# Patient Record
Sex: Female | Born: 2000 | Race: White | Hispanic: No | Marital: Single | Smoking: Never smoker
Health system: Southern US, Community
[De-identification: ages and names within clinical notes are randomized; demographics above are authoritative.]

## PROBLEM LIST (undated history)

## (undated) DIAGNOSIS — G43909 Migraine, unspecified, not intractable, without status migrainosus: Secondary | ICD-10-CM

## (undated) DIAGNOSIS — J309 Allergic rhinitis, unspecified: Secondary | ICD-10-CM

## (undated) HISTORY — PX: TYMPANOSTOMY TUBE PLACEMENT: SHX32

## (undated) HISTORY — DX: Allergic rhinitis, unspecified: J30.9

## (undated) HISTORY — DX: Migraine, unspecified, not intractable, without status migrainosus: G43.909

## (undated) HISTORY — PX: TYMPANOPLASTY: SHX33

## (undated) HISTORY — PX: WISDOM TOOTH EXTRACTION: SHX21

## (undated) HISTORY — PX: UPPER GI ENDOSCOPY: SHX6162

---
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
## 2021-04-16 ENCOUNTER — Encounter (INDEPENDENT_AMBULATORY_CARE_PROVIDER_SITE_OTHER): Payer: Self-pay

## 2021-04-17 ENCOUNTER — Encounter: Payer: Self-pay | Admitting: Family Medicine

## 2021-04-17 ENCOUNTER — Ambulatory Visit (INDEPENDENT_AMBULATORY_CARE_PROVIDER_SITE_OTHER): Payer: BC Managed Care – PPO | Admitting: Family Medicine

## 2021-04-17 VITALS — HR 117 | Temp 98.8°F | Ht 61.52 in | Wt 118.0 lb

## 2021-04-17 DIAGNOSIS — J019 Acute sinusitis, unspecified: Secondary | ICD-10-CM

## 2021-04-17 DIAGNOSIS — B9689 Other specified bacterial agents as the cause of diseases classified elsewhere: Secondary | ICD-10-CM

## 2021-04-17 DIAGNOSIS — J029 Acute pharyngitis, unspecified: Secondary | ICD-10-CM | POA: Diagnosis not present

## 2021-04-17 LAB — POCT RAPID STREP A (OFFICE): Rapid Strep A Screen: NEGATIVE

## 2021-04-17 MED ORDER — DOXYCYCLINE HYCLATE 100 MG PO TABS: 100.0000 mg | ORAL_TABLET | Freq: Two times a day (BID) | ORAL | 0 refills | Status: DC

## 2021-04-17 NOTE — Patient Instructions (Signed)
If all symptoms including tender area behind right ear does not completely go away, come back.       Sinusitis, Adult Sinusitis is soreness and swelling (inflammation) of your sinuses. Sinuses are hollow spaces in the bones around your face. They are located:  Around your eyes.  In the middle of your forehead.  Behind your nose.  In your cheekbones. Your sinuses and nasal passages are lined with a fluid called mucus. Mucus drains out of your sinuses. Swelling can trap mucus in your sinuses. This lets germs (bacteria, virus, or fungus) grow, which leads to infection. Most of the time, this condition is caused by a virus. What are the causes? This condition is caused by:  Allergies.  Asthma.  Germs.  Things that block your nose or sinuses.  Growths in the nose (nasal polyps).  Chemicals or irritants in the air.  Fungus (rare). What increases the risk? You are more likely to develop this condition if:  You have a weak body defense system (immune system).  You do a lot of swimming or diving.  You use nasal sprays too much.  You smoke. What are the signs or symptoms? The main symptoms of this condition are pain and a feeling of pressure around the sinuses. Other symptoms include:  Stuffy nose (congestion).  Runny nose (drainage).  Swelling and warmth in the sinuses.  Headache.  Toothache.  A cough that may get worse at night.  Mucus that collects in the throat or the back of the nose (postnasal drip).  Being unable to smell and taste.  Being very tired (fatigue).  A fever.  Sore throat.  Bad breath. How is this diagnosed? This condition is diagnosed based on:  Your symptoms.  Your medical history.  A physical exam.  Tests to find out if your condition is short-term (acute) or long-term (chronic). Your doctor may: ? Check your nose for growths (polyps). ? Check your sinuses using a tool that has a light (endoscope). ? Check for allergies or  germs. ? Do imaging tests, such as an MRI or CT scan. How is this treated? Treatment for this condition depends on the cause and whether it is short-term or long-term.  If caused by a virus, your symptoms should go away on their own within 10 days. You may be given medicines to relieve symptoms. They include: ? Medicines that shrink swollen tissue in the nose. ? Medicines that treat allergies (antihistamines). ? A spray that treats swelling of the nostrils. ? Rinses that help get rid of thick mucus in your nose (nasal saline washes).  If caused by bacteria, your doctor may wait to see if you will get better without treatment. You may be given antibiotic medicine if you have: ? A very bad infection. ? A weak body defense system.  If caused by growths in the nose, you may need to have surgery. Follow these instructions at home: Medicines  Take, use, or apply over-the-counter and prescription medicines only as told by your doctor. These may include nasal sprays.  If you were prescribed an antibiotic medicine, take it as told by your doctor. Do not stop taking the antibiotic even if you start to feel better. Hydrate and humidify  Drink enough water to keep your pee (urine) pale yellow.  Use a cool mist humidifier to keep the humidity level in your home above 50%.  Breathe in steam for 10-15 minutes, 3-4 times a day, or as told by your doctor. You can do  this in the bathroom while a hot shower is running.  Try not to spend time in cool or dry air.   Rest  Rest as much as you can.  Sleep with your head raised (elevated).  Make sure you get enough sleep each night. General instructions  Put a warm, moist washcloth on your face 3-4 times a day, or as often as told by your doctor. This will help with discomfort.  Wash your hands often with soap and water. If there is no soap and water, use hand sanitizer.  Do not smoke. Avoid being around people who are smoking (secondhand  smoke).  Keep all follow-up visits as told by your doctor. This is important.   Contact a doctor if:  You have a fever.  Your symptoms get worse.  Your symptoms do not get better within 10 days. Get help right away if:  You have a very bad headache.  You cannot stop throwing up (vomiting).  You have very bad pain or swelling around your face or eyes.  You have trouble seeing.  You feel confused.  Your neck is stiff.  You have trouble breathing. Summary  Sinusitis is swelling of your sinuses. Sinuses are hollow spaces in the bones around your face.  This condition is caused by tissues in your nose that become inflamed or swollen. This traps germs. These can lead to infection.  If you were prescribed an antibiotic medicine, take it as told by your doctor. Do not stop taking it even if you start to feel better.  Keep all follow-up visits as told by your doctor. This is important. This information is not intended to replace advice given to you by your health care provider. Make sure you discuss any questions you have with your health care provider. Document Revised: 05/01/2018 Document Reviewed: 05/01/2018 Elsevier Patient Education  2021 [employer].

## 2021-04-17 NOTE — Progress Notes (Signed)
Patient ID: Cynthia Delacruz, female    DOB: 2001/11/26, 20 y.o.   MRN: [id number]   Chief Complaint  Patient presents with  . Cough    Sore throat, headache, stuffy nose for approx 12 days not better took delsyum, tylenol cold products   Subjective:  Cc: sore throat and cough with headache   This is a new problem. Presents today for sore throat, headache and stuffy nose. Also reports has a sore place behind right ear.  Symptoms have been present for 12 days.  She has done a home COVID test which has been negative.  She reports that sore throat is worse at night, fever and chills worse at night, cough is worse in the morning.  Has tried Delsym, Tylenol Sinus, and Tylenol Cold and flu.  Able to take fluids adequately.    Medical History Cynthia Delacruz has a past medical history of Allergic rhinitis and Migraines.   Outpatient Encounter Medications as of 04/17/2021  Medication Sig  . doxycycline (VIBRA-TABS) 100 MG tablet Take 1 tablet (100 mg total) by mouth 2 (two) times daily.  . LO LOESTRIN FE 1 MG-10 MCG / 10 MCG tablet TAKE 1 TABLET BY MOUTH EVERY DAY   No facility-administered encounter medications on file as of 04/17/2021.     Review of Systems  Constitutional: Positive for chills and fever.  HENT: Positive for sinus pressure, sinus pain and sore throat.   Respiratory: Positive for cough.   Gastrointestinal: Negative for abdominal pain.  Neurological: Positive for headaches. Negative for dizziness and light-headedness.     Vitals Pulse (!) 117   Temp 98.8 F (37.1 C)   Ht 5' 1.52" (1.563 m)   Wt 118 lb (53.5 kg)   SpO2 97%   BMI 21.92 kg/m   Objective:   Physical Exam Vitals reviewed.  Constitutional:      Appearance: Normal appearance.  HENT:     Right Ear: Tenderness present. Tympanic membrane is erythematous.     Left Ear: Tympanic membrane normal.     Ears:     Comments: Prominent sore area behind right ear, not on mastoid bone. No erythema or warmth noted.  Tender to touch.     Nose:     Right Turbinates: Swollen.     Left Turbinates: Swollen.     Right Sinus: Maxillary sinus tenderness and frontal sinus tenderness present.     Left Sinus: Maxillary sinus tenderness and frontal sinus tenderness present.     Mouth/Throat:     Pharynx: Posterior oropharyngeal erythema present. No oropharyngeal exudate.     Tonsils: No tonsillar exudate. 1+ on the right. 1+ on the left.  Cardiovascular:     Rate and Rhythm: Normal rate and regular rhythm.     Heart sounds: Normal heart sounds.  Pulmonary:     Effort: Pulmonary effort is normal.     Breath sounds: Normal breath sounds.  Skin:    General: Skin is warm and dry.  Neurological:     General: No focal deficit present.     Mental Status: She is alert.  Psychiatric:        Behavior: Behavior normal.      Assessment and Plan   1. Sore throat - POCT rapid strep A - Culture, Group A Strep  2. Acute bacterial rhinosinusitis - doxycycline (VIBRA-TABS) 100 MG tablet; Take 1 tablet (100 mg total) by mouth 2 (two) times daily.  Dispense: 20 tablet; Refill: 0    Rapid strep negative,  will send for culture.  Significant maxillary and frontal sinus pain and pressure upon physical exam.  Will treat for acute bacterial sinusitis with doxycycline 2 times per day for 10 days.  Has allergy to cephalosporin with hives.  Recommend supportive therapy, adequate hydration.  Agrees with plan of care discussed today. Understands warning signs to seek further care: chest pain, shortness of breath, any significant change in health.  Understands to follow-up if symptoms do not completely resolve (tender area behind right ear), follow-up at this office for further evaluation/treatment.  Understands the importance of this guidance.    Dorena Bodo, NP 04/17/2021

## 2021-04-20 ENCOUNTER — Other Ambulatory Visit: Payer: Self-pay | Admitting: Family Medicine

## 2021-04-20 ENCOUNTER — Other Ambulatory Visit: Payer: Self-pay

## 2021-04-20 ENCOUNTER — Telehealth: Payer: Self-pay

## 2021-04-20 DIAGNOSIS — J019 Acute sinusitis, unspecified: Secondary | ICD-10-CM

## 2021-04-20 DIAGNOSIS — B9689 Other specified bacterial agents as the cause of diseases classified elsewhere: Secondary | ICD-10-CM

## 2021-04-20 LAB — CULTURE, GROUP A STREP: Strep A Culture: NEGATIVE

## 2021-04-20 MED ORDER — AZITHROMYCIN 250 MG PO TABS: ORAL_TABLET | ORAL | 0 refills | Status: DC

## 2021-04-20 NOTE — Telephone Encounter (Signed)
Patient describes having had an allergic reaction to doxycycline prescribed 04/17/21- symptoms are rash on chest, abd pain , vomiting x 2 , itching in hands and feet. Requesting another antibiotic to be called in , please advise

## 2021-04-20 NOTE — Telephone Encounter (Signed)
Patient states had allergic reaction to doxycycline 100 mg and wanting something else called into [facility]

## 2021-04-20 NOTE — Telephone Encounter (Signed)
Sent z-pak kd

## 2021-04-20 NOTE — Progress Notes (Signed)
Stop doxy due to reaction and start azithromycin as prescribed.

## 2021-04-20 NOTE — Telephone Encounter (Signed)
Called patient has been notified of z pack prescription sent to pharmacy.

## 2021-06-26 ENCOUNTER — Telehealth: Payer: Self-pay | Admitting: Family Medicine

## 2021-06-26 NOTE — Telephone Encounter (Signed)
Pt is needing a shot record for school

## 2021-06-26 NOTE — Telephone Encounter (Signed)
Pt has nurse visit 7/19 and vaccine record at nurse station to give to her at time of her visit. Up to date on vaccines.

## 2021-06-30 ENCOUNTER — Other Ambulatory Visit: Payer: Self-pay

## 2021-06-30 ENCOUNTER — Other Ambulatory Visit (INDEPENDENT_AMBULATORY_CARE_PROVIDER_SITE_OTHER): Payer: BC Managed Care – PPO | Admitting: *Deleted

## 2021-06-30 DIAGNOSIS — Z111 Encounter for screening for respiratory tuberculosis: Secondary | ICD-10-CM | POA: Diagnosis not present

## 2021-06-30 NOTE — Telephone Encounter (Signed)
Patient left vaccine papers to be completed and signed for college - forms completed and patient will pick up when she comes to have TB skin test read 07/02/21

## 2021-07-01 ENCOUNTER — Encounter: Payer: Self-pay | Admitting: Nurse Practitioner

## 2021-07-01 ENCOUNTER — Ambulatory Visit (INDEPENDENT_AMBULATORY_CARE_PROVIDER_SITE_OTHER): Payer: BC Managed Care – PPO | Admitting: Nurse Practitioner

## 2021-07-01 VITALS — BP 114/76 | HR 108 | Temp 97.3°F | Ht 61.0 in | Wt 114.0 lb

## 2021-07-01 DIAGNOSIS — Z0000 Encounter for general adult medical examination without abnormal findings: Secondary | ICD-10-CM

## 2021-07-01 DIAGNOSIS — Z0001 Encounter for general adult medical examination with abnormal findings: Secondary | ICD-10-CM | POA: Diagnosis not present

## 2021-07-01 DIAGNOSIS — E0789 Other specified disorders of thyroid: Secondary | ICD-10-CM

## 2021-07-01 DIAGNOSIS — R5383 Other fatigue: Secondary | ICD-10-CM | POA: Diagnosis not present

## 2021-07-01 NOTE — Progress Notes (Signed)
Subjective:    Patient ID: Cynthia Delacruz, female    DOB: [birth year], 20 y.o.   MRN: [id number]  HPI  The patient comes in today for a wellness visit.    A review of their health history was completed.  A review of medications was also completed.  Any needed refills; none  Eating habits: good  Falls/  MVA accidents in past few months: none  Regular exercise: yes  Specialist pt sees on regular basis: none  Preventative health issues were discussed.   Additional concerns:form completion Presents for a wellness exam and completion of a form for school.  Staying active.  Eats healthy.  Reflux has been stable.  Sleeping well.  Regular dental exams.  Denies any visual issues.  Gets regular gynecological exams with her gynecologist. Has been experiencing more fatigue.  Has a strong family history of thyroid disorders in both parents.  Review of Systems  Constitutional:  Positive for fatigue. Negative for activity change, appetite change, fever and unexpected weight change.  HENT:  Positive for sore throat. Negative for trouble swallowing.   Respiratory:  Negative for cough, chest tightness, shortness of breath and wheezing.   Cardiovascular:  Negative for chest pain and palpitations.  Gastrointestinal:  Negative for abdominal distention, abdominal pain, constipation, diarrhea, nausea and vomiting.  Genitourinary:  Negative for difficulty urinating, dysuria, enuresis, frequency and urgency.  Depression screen PHQ 2/9 04/17/2021  Decreased Interest 0  Down, Depressed, Hopeless 0  PHQ - 2 Score 0  Altered sleeping -  Tired, decreased energy -  Change in appetite -  Feeling bad or failure about yourself  -  Trouble concentrating -  Moving slowly or fidgety/restless -  Suicidal thoughts -  PHQ-9 Score -  Difficult doing work/chores -       Objective:   Physical Exam Constitutional:      General: She is not in acute distress.    Appearance: She is well-developed.  Neck:      Thyroid: No thyromegaly.     Trachea: No tracheal deviation.     Comments: Thyroid tender to palpation. No mass or goiter noted. No obvious enlargement.  Cardiovascular:     Rate and Rhythm: Normal rate and regular rhythm.     Heart sounds: Normal heart sounds. No murmur heard. Pulmonary:     Effort: Pulmonary effort is normal.     Breath sounds: Normal breath sounds.  Abdominal:     General: There is no distension.     Palpations: Abdomen is soft.     Tenderness: There is no abdominal tenderness.  Genitourinary:    Vagina: Normal.  Musculoskeletal:     Cervical back: Normal range of motion and neck supple.  Lymphadenopathy:     Cervical: No cervical adenopathy.  Skin:    General: Skin is warm and dry.     Findings: No rash.  Neurological:     Mental Status: She is alert and oriented to person, place, and time.     Coordination: Coordination normal.     Deep Tendon Reflexes: Reflexes normal.  Psychiatric:        Mood and Affect: Mood normal.        Behavior: Behavior normal.        Thought Content: Thought content normal.        Judgment: Judgment normal.  Today's Vitals   07/01/21 1329  BP: 114/76  Pulse: (!) 108  Temp: (!) 97.3 F (36.3 C)  SpO2: 100%  Weight:  114 lb (51.7 kg)  Height: 5\' 1"  (1.549 m)   Body mass index is 21.54 kg/m.      Assessment & Plan:  Routine general medical examination at a health care facility  Fatigue, unspecified type - Plan: Comprehensive metabolic panel, CBC with Differential/Platelet  Thyroid pain - Plan: Comprehensive metabolic panel, CBC with Differential/Platelet, TSH, Thyroid antibodies  Encourage continued activity and healthy diet. Lab work pending. Follow-up depending on lab results.

## 2021-07-02 LAB — TB SKIN TEST
Induration: 0 mm
TB Skin Test: NEGATIVE

## 2021-07-02 NOTE — Telephone Encounter (Signed)
Patient came in today , had first TB skin test read - resulted negative. Copy provided to patient. Has appt for 2nd TB skin test already set up .

## 2021-07-03 ENCOUNTER — Encounter: Payer: Self-pay | Admitting: Nurse Practitioner

## 2021-07-03 LAB — THYROID ANTIBODIES
Thyroglobulin Antibody: 1.3 IU/mL — ABNORMAL HIGH (ref 0.0–0.9)
Thyroperoxidase Ab SerPl-aCnc: 84 IU/mL — ABNORMAL HIGH (ref 0–26)

## 2021-07-03 LAB — CBC WITH DIFFERENTIAL/PLATELET
Basophils Absolute: 0.1 10*3/uL (ref 0.0–0.2)
Basos: 1 %
EOS (ABSOLUTE): 0.1 10*3/uL (ref 0.0–0.4)
Eos: 2 %
Hematocrit: 45.3 % (ref 34.0–46.6)
Hemoglobin: 15.6 g/dL (ref 11.1–15.9)
Immature Grans (Abs): 0 10*3/uL (ref 0.0–0.1)
Immature Granulocytes: 0 %
Lymphocytes Absolute: 2 10*3/uL (ref 0.7–3.1)
Lymphs: 43 %
MCH: 30.8 pg (ref 26.6–33.0)
MCHC: 34.4 g/dL (ref 31.5–35.7)
MCV: 89 fL (ref 79–97)
Monocytes Absolute: 0.4 10*3/uL (ref 0.1–0.9)
Monocytes: 9 %
Neutrophils Absolute: 2.1 10*3/uL (ref 1.4–7.0)
Neutrophils: 45 %
Platelets: 259 10*3/uL (ref 150–450)
RBC: 5.07 x10E6/uL (ref 3.77–5.28)
RDW: 13 % (ref 11.7–15.4)
WBC: 4.7 10*3/uL (ref 3.4–10.8)

## 2021-07-03 LAB — COMPREHENSIVE METABOLIC PANEL
ALT: 7 IU/L (ref 0–32)
AST: 18 IU/L (ref 0–40)
Albumin/Globulin Ratio: 1.7 (ref 1.2–2.2)
Albumin: 4.6 g/dL (ref 3.9–5.0)
Alkaline Phosphatase: 51 IU/L (ref 42–106)
BUN/Creatinine Ratio: 13 (ref 9–23)
BUN: 10 mg/dL (ref 6–20)
Bilirubin Total: 0.3 mg/dL (ref 0.0–1.2)
CO2: 22 mmol/L (ref 20–29)
Calcium: 10 mg/dL (ref 8.7–10.2)
Chloride: 101 mmol/L (ref 96–106)
Creatinine, Ser: 0.78 mg/dL (ref 0.57–1.00)
Globulin, Total: 2.7 g/dL (ref 1.5–4.5)
Glucose: 75 mg/dL (ref 65–99)
Potassium: 4.3 mmol/L (ref 3.5–5.2)
Sodium: 137 mmol/L (ref 134–144)
Total Protein: 7.3 g/dL (ref 6.0–8.5)
eGFR: 112 mL/min/{1.73_m2} (ref >59)

## 2021-07-03 LAB — TSH: TSH: 2.89 u[IU]/mL (ref 0.450–4.500)

## 2021-07-03 NOTE — Telephone Encounter (Signed)
Note sent with results.

## 2021-07-14 ENCOUNTER — Other Ambulatory Visit (INDEPENDENT_AMBULATORY_CARE_PROVIDER_SITE_OTHER): Payer: BC Managed Care – PPO

## 2021-07-14 ENCOUNTER — Other Ambulatory Visit: Payer: Self-pay

## 2021-07-14 DIAGNOSIS — Z111 Encounter for screening for respiratory tuberculosis: Secondary | ICD-10-CM

## 2021-07-16 LAB — TB SKIN TEST
Induration: 0 mm
TB Skin Test: NEGATIVE

## 2021-07-17 ENCOUNTER — Telehealth: Payer: Self-pay | Admitting: Family Medicine

## 2021-07-17 ENCOUNTER — Other Ambulatory Visit: Payer: Self-pay

## 2021-07-17 ENCOUNTER — Encounter: Payer: Self-pay | Admitting: Family Medicine

## 2021-07-17 ENCOUNTER — Ambulatory Visit (INDEPENDENT_AMBULATORY_CARE_PROVIDER_SITE_OTHER): Payer: BC Managed Care – PPO | Admitting: Family Medicine

## 2021-07-17 VITALS — HR 92 | Temp 100.2°F

## 2021-07-17 DIAGNOSIS — J020 Streptococcal pharyngitis: Secondary | ICD-10-CM | POA: Diagnosis not present

## 2021-07-17 DIAGNOSIS — J029 Acute pharyngitis, unspecified: Secondary | ICD-10-CM

## 2021-07-17 LAB — POCT RAPID STREP A (OFFICE): Rapid Strep A Screen: NEGATIVE

## 2021-07-17 MED ORDER — AZITHROMYCIN 250 MG PO TABS: ORAL_TABLET | ORAL | 0 refills | Status: AC

## 2021-07-17 NOTE — Progress Notes (Signed)
Patient ID: Cynthia Delacruz, female    DOB: November 27, 2001, 20 y.o.   MRN: [id number]   Chief Complaint  Patient presents with   Cough    Sore throat, body aches, cough -swollen lymph nodes for few days Covid test negative   Subjective:    HPI Cc- coughing Has been going on past 2 days.  Thought might be related to having her recent TB test.  Pt moving to college soon on 07/31/21 and wondering if she is sick or if able to still move. Having coughing, sore throat, myalgia,  And swollen lymph nodes for past 2 days. No covid contacts. No sick contacts.  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  TB test was negative.  Medical History Readlyn has a past medical history of Allergic rhinitis and Migraines.   Outpatient Encounter Medications as of 07/17/2021  Medication Sig   [EXPIRED] azithromycin (ZITHROMAX) 250 MG tablet Take 2 tablets on day 1, then 1 tablet daily on days 2 through 5   LO LOESTRIN FE 1 MG-10 MCG / 10 MCG tablet TAKE 1 TABLET BY MOUTH EVERY DAY   No facility-administered encounter medications on file as of 07/17/2021.     Review of Systems  Constitutional:  Negative for chills and fever.  HENT:  Positive for congestion and sore throat. Negative for rhinorrhea.        +tenderness in lymph nodes in neck  Respiratory:  Positive for cough. Negative for shortness of breath and wheezing.   Cardiovascular:  Negative for chest pain and leg swelling.  Gastrointestinal:  Negative for abdominal pain, diarrhea, nausea and vomiting.  Genitourinary:  Negative for dysuria and frequency.  Musculoskeletal:  Negative for arthralgias and back pain.  Skin:  Negative for rash.  Neurological:  Negative for dizziness, weakness and headaches.    Vitals Pulse 92   Temp 100.2 F (37.9 C) (Oral)   SpO2 99%   Objective:   Physical Exam Vitals and nursing note reviewed.  Constitutional:      General: She is not in acute distress.    Appearance: Normal appearance. She is not  ill-appearing or toxic-appearing.  HENT:     Head: Normocephalic and atraumatic.     Right Ear: Tympanic membrane, ear canal and external ear normal.     Left Ear: Tympanic membrane, ear canal and external ear normal.     Nose: Nose normal. No congestion or rhinorrhea.     Mouth/Throat:     Mouth: Mucous membranes are moist.     Pharynx: Oropharyngeal exudate and posterior oropharyngeal erythema (mild) present.  Eyes:     Extraocular Movements: Extraocular movements intact.     Conjunctiva/sclera: Conjunctivae normal.     Pupils: Pupils are equal, round, and reactive to light.  Cardiovascular:     Rate and Rhythm: Normal rate and regular rhythm.     Pulses: Normal pulses.     Heart sounds: Normal heart sounds.  Pulmonary:     Effort: Pulmonary effort is normal. No respiratory distress.     Breath sounds: Normal breath sounds. No wheezing, rhonchi or rales.  Musculoskeletal:        General: Normal range of motion.     Cervical back: Normal range of motion and neck supple. Tenderness (anterior cervical LNs) present.     Right lower leg: No edema.     Left lower leg: No edema.  Lymphadenopathy:     Cervical: Cervical adenopathy present.  Skin:    General:  Skin is warm and dry.     Findings: No lesion or rash.  Neurological:     General: No focal deficit present.     Mental Status: She is alert and oriented to person, place, and time.  Psychiatric:        Mood and Affect: Mood normal.        Behavior: Behavior normal.     Assessment and Plan   1. Strep pharyngitis - POCT rapid strep A - Novel Coronavirus, NAA ([facility]) - azithromycin (ZITHROMAX) 250 MG tablet; Take 2 tablets on day 1, then 1 tablet daily on days 2 through 5  Dispense: 6 tablet; Refill: 0 - SARS-COV-2, NAA 2 DAY TAT - Specimen status report  \ Based on centor criteria- will test with azithromycin, has pcn allergy. Pt given symptomatic tx and to use cholarseptic spray,lozenges, tylenol, ibuprofen, salt  water gargles.   Call or rto if not improving Pending covid testing. Quarantine till results come back.   Return if symptoms worsen or fail to improve.

## 2021-07-17 NOTE — Telephone Encounter (Signed)
Pls call pt and let her know sent azithromycin to pharmacy in case things worsen over the weekend and having a fever, since had low grade temp in office at 100.2   Call on Monday if not improving.  Thx.   Dr. Ladona Ridgel

## 2021-07-17 NOTE — Telephone Encounter (Signed)
Patient notified and verbalized understanding. 

## 2021-07-18 LAB — SARS-COV-2, NAA 2 DAY TAT

## 2021-07-18 LAB — NOVEL CORONAVIRUS, NAA: SARS-CoV-2, NAA: NOT DETECTED

## 2021-07-18 LAB — SPECIMEN STATUS REPORT

## 2021-12-16 NOTE — Progress Notes (Deleted)
21 y.o. G0P0000 Single White or Caucasian Not Hispanic or Latino female here for annual exam.      No LMP recorded. (Menstrual status: Oral contraceptives).          Sexually active: {yes no:314532}  The current method of family planning is {contraception:315051}.    Exercising: {yes no:314532}  {types:19826} Smoker:  {YES NO:22349}  Health Maintenance: Pap:  none  History of abnormal Pap:  no MMG:  none  BMD:   none  Colonoscopy: none  TDaP:  06/21/13 Gardasil: complete    reports that she has never smoked. She has never used smokeless tobacco. She reports that she does not drink alcohol and does not use drugs.  Past Medical History:  Diagnosis Date   Allergic rhinitis    Migraines     Past Surgical History:  Procedure Laterality Date   TYMPANOPLASTY     replacement x 2   TYMPANOSTOMY TUBE PLACEMENT     UPPER GI ENDOSCOPY     for acid reflux   WISDOM TOOTH EXTRACTION      Current Outpatient Medications  Medication Sig Dispense Refill   LO LOESTRIN FE 1 MG-10 MCG / 10 MCG tablet TAKE 1 TABLET BY MOUTH EVERY DAY 84 tablet 3   No current facility-administered medications for this visit.    Family History  Problem Relation Age of Onset   Hyperthyroidism Mother    Other Father        substance abuse   Osteoporosis Maternal Grandmother    Hypertension Maternal Grandmother    Diabetes Paternal Grandmother    Pancreatitis Paternal Grandmother     Review of Systems  Exam:   There were no vitals taken for this visit.  Weight change: @WEIGHTCHANGE @ Height:      Ht Readings from Last 3 Encounters:  07/01/21 5\' 1"  (1.549 m) (10 %, Z= -1.28)*  04/17/21 5' 1.52" (1.563 m) (14 %, Z= -1.08)*  12/03/20 5' 1.5" (1.562 m) (14 %, Z= -1.08)*   * Growth percentiles are based on CDC (Girls, 2-20 Years) data.    General appearance: alert, cooperative and appears stated age Head: Normocephalic, without obvious abnormality, atraumatic Neck: no adenopathy, supple, symmetrical,  trachea midline and thyroid {CHL AMB PHY EX THYROID NORM [phone]::"normal to inspection and palpation"} Lungs: clear to auscultation bilaterally Cardiovascular: regular rate and rhythm Breasts: {Exam; breast:13139::"normal appearance, no masses or tenderness"} Abdomen: soft, non-tender; non distended,  no masses,  no organomegaly Extremities: extremities normal, atraumatic, no cyanosis or edema Skin: Skin color, texture, turgor normal. No rashes or lesions Lymph nodes: Cervical, supraclavicular, and axillary nodes normal. No abnormal inguinal nodes palpated Neurologic: Grossly normal   Pelvic: External genitalia:  no lesions              Urethra:  normal appearing urethra with no masses, tenderness or lesions              Bartholins and Skenes: normal                 Vagina: normal appearing vagina with normal color and discharge, no lesions              Cervix: {CHL AMB PHY EX CERVIX NORM DEFAULT:[phone]::"no lesions"}               Bimanual Exam:  Uterus:  {CHL AMB PHY EX UTERUS NORM [phone]::"normal size, contour, position, consistency, mobility, non-tender"}              Adnexa: {CHL  AMB PHY EX ADNEXA NO MASS DEFAULT:[phone]::"no mass, fullness, tenderness"}               Rectovaginal: Confirms               Anus:  normal sphincter tone, no lesions  *** chaperoned for the exam.  A:  Well Woman with normal exam  P:

## 2021-12-21 ENCOUNTER — Ambulatory Visit: Payer: BC Managed Care – PPO | Admitting: Obstetrics and Gynecology

## 2022-02-25 ENCOUNTER — Other Ambulatory Visit: Payer: Self-pay | Admitting: Obstetrics and Gynecology

## 2022-02-26 ENCOUNTER — Other Ambulatory Visit: Payer: Self-pay | Admitting: Obstetrics and Gynecology

## 2022-02-26 DIAGNOSIS — Z3041 Encounter for surveillance of contraceptive pills: Secondary | ICD-10-CM

## 2022-02-26 NOTE — Telephone Encounter (Signed)
Pt also called requesting refill of BC.  ? ?Pt notified that we need an annual check up scheduled before additional refills. Pt voiced understanding. States that she goes to school out of town. [place] and she would appreciate if we could make it virtual. ? ?Currently has 2weeks left of one pack and then an additional pack. ? ?Will send msg to scheduling to set up appt.  ?

## 2022-03-02 ENCOUNTER — Telehealth: Payer: BC Managed Care – PPO | Admitting: Obstetrics and Gynecology

## 2022-03-02 ENCOUNTER — Other Ambulatory Visit: Payer: Self-pay

## 2022-03-02 DIAGNOSIS — Z3041 Encounter for surveillance of contraceptive pills: Secondary | ICD-10-CM

## 2022-03-02 MED ORDER — LO LOESTRIN FE 1 MG-10 MCG / 10 MCG PO TABS: 1.0000 | ORAL_TABLET | Freq: Every day | ORAL | 0 refills | Status: DC

## 2022-03-02 NOTE — Progress Notes (Deleted)
Virtual Visit via Video Note ? ?I connected with Cynthia Delacruz on 03/02/22 at 11:30 AM EDT by a video enabled telemedicine application and verified that I am speaking with the correct person using two identifiers. ? ?Location: ?Patient: *** ?Provider: *** ?  ?I discussed the limitations of evaluation and management by telemedicine and the availability of in person appointments. The patient expressed understanding and agreed to proceed. ? ?GYNECOLOGY  VISIT ?  ?HPI: ?21 y.o.   Single White or Caucasian Not Hispanic or Latino  female   ?G0P0000 with No LMP recorded. (Menstrual status: Oral contraceptives).   ?here for    ? ?GYNECOLOGIC HISTORY: ?No LMP recorded. (Menstrual status: Oral contraceptives). ?Contraception:*** ?Menopausal hormone therapy: *** ?       ?OB History   ? ? Gravida  ?0  ? Para  ?0  ? Term  ?0  ? Preterm  ?0  ? AB  ?0  ? Living  ?0  ?  ? ? SAB  ?0  ? IAB  ?0  ? Ectopic  ?0  ? Multiple  ?0  ? Live Births  ?0  ?   ?  ?  ?    ? ?Patient Active Problem List  ? Diagnosis Date Noted  ? Acute bacterial rhinosinusitis 11/11/2020  ? Dysmenorrhea 05/29/2018  ? Reflux 11/15/2011  ? ? ?Past Medical History:  ?Diagnosis Date  ? Allergic rhinitis   ? Migraines   ? ? ?Past Surgical History:  ?Procedure Laterality Date  ? TYMPANOPLASTY    ? replacement x 2  ? TYMPANOSTOMY TUBE PLACEMENT    ? UPPER GI ENDOSCOPY    ? for acid reflux  ? WISDOM TOOTH EXTRACTION    ? ? ?Current Outpatient Medications  ?Medication Sig Dispense Refill  ? LO LOESTRIN FE 1 MG-10 MCG / 10 MCG tablet TAKE 1 TABLET BY MOUTH EVERY DAY 84 tablet 3  ? ?No current facility-administered medications for this visit.  ?  ? ?ALLERGIES: Doxycycline and Cefdinir ? ?Family History  ?Problem Relation Age of Onset  ? Hyperthyroidism Mother   ? Other Father   ?     substance abuse  ? Osteoporosis Maternal Grandmother   ? Hypertension Maternal Grandmother   ? Diabetes Paternal Grandmother   ? Pancreatitis Paternal Grandmother   ? ? ?Social History   ? ?Socioeconomic History  ? Marital status: Single  ?  Spouse name: Not on file  ? Number of children: Not on file  ? Years of education: Not on file  ? Highest education level: Not on file  ?Occupational History  ? Not on file  ?Tobacco Use  ? Smoking status: Never  ? Smokeless tobacco: Never  ?Vaping Use  ? Vaping Use: Never used  ?Substance and Sexual Activity  ? Alcohol use: Never  ? Drug use: Never  ? Sexual activity: Never  ?  Birth control/protection: OCP  ?  Comment: never sexually active  ?Other Topics Concern  ? Not on file  ?Social History Narrative  ? Not on file  ? ?Social Determinants of Health  ? ?Financial Resource Strain: Not on file  ?Food Insecurity: Not on file  ?Transportation Needs: Not on file  ?Physical Activity: Not on file  ?Stress: Not on file  ?Social Connections: Not on file  ?Intimate Partner Violence: Not on file  ? ? ?ROS ? ?PHYSICAL EXAMINATION:   ? ?There were no vitals taken for this visit.    ?General appearance: alert, cooperative and appears  stated age ?

## 2022-03-02 NOTE — Telephone Encounter (Signed)
Medication refill request: Lo Loestrin FE  ?Last AEX:  12/03/20 ?Next AEX: 04/26/22 ?Last MMG (if hormonal medication request): n/a ?Refill authorized: # 84 with 0 RF to get her to her AEX.  ? ?

## 2022-04-19 NOTE — Progress Notes (Signed)
21 y.o. G0P0000 Single White or Caucasian Not Hispanic or Latino female here for annual exam.  On OCP's cyclically, no menses.  ?Not currently active currently.  ? ?Last year she had a + thyroid antibody ?  ? ?No LMP recorded. (Menstrual status: Oral contraceptives).          ?Sexually active: No.  ?The current method of family planning is OCP (estrogen/progesterone).    ?Exercising: Yes.     Cardio and light weights  ?Smoker:  no ? ?Health Maintenance: ?Pap:  none  ?History of abnormal Pap:  no ?MMG:  N/A ?BMD:   N/A ?Colonoscopy: N/A ?TDaP:  2014  ?Gardasil: complete  ? ? reports that she has never smoked. She has never used smokeless tobacco. She reports that she does not drink alcohol and does not use drugs. 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  ? ?Past Medical History:  ?Diagnosis Date  ? Allergic rhinitis   ? Migraines   ? ? ?Past Surgical History:  ?Procedure Laterality Date  ? TYMPANOPLASTY    ? replacement x 2  ? TYMPANOSTOMY TUBE PLACEMENT    ? UPPER GI ENDOSCOPY    ? for acid reflux  ? WISDOM TOOTH EXTRACTION    ? ? ?Current Outpatient Medications  ?Medication Sig Dispense Refill  ? Norethindrone-Ethinyl Estradiol-Fe Biphas (LO LOESTRIN FE) 1 MG-10 MCG / 10 MCG tablet Take 1 tablet by mouth daily. 84 tablet 0  ? ?No current facility-administered medications for this visit.  ? ? ?Family History  ?Problem Relation Age of Onset  ? Hyperthyroidism Mother   ? Other Father   ?     substance abuse  ? Osteoporosis Maternal Grandmother   ? Hypertension Maternal Grandmother   ? Diabetes Paternal Grandmother   ? Pancreatitis Paternal Grandmother   ? ? ?Review of Systems  ?All other systems reviewed and are negative. ? ?Exam:   ?BP 116/68   Pulse 85   Ht 5\' 1"  (1.549 m)   Wt 121 lb (54.9 kg)   SpO2 99%   BMI 22.86 kg/m?   Weight change: @WEIGHTCHANGE @ Height:   Height: 5\' 1"  (154.9 cm)  ?Ht Readings from Last 3 Encounters:  ?04/26/22 5\' 1"  (1.549 m) (10 %, Z= -1.29)*  ?07/01/21 5\' 1"  (1.549  m) (10 %, Z= -1.28)*  ?04/17/21 5' 1.52" (1.563 m) (14 %, Z= -1.08)*  ? ?* Growth percentiles are based on CDC (Girls, 2-20 Years) data.  ? ? ?General appearance: alert, cooperative and appears stated age ?Head: Normocephalic, without obvious abnormality, atraumatic ?Neck: no adenopathy, supple, symmetrical, trachea midline and thyroid normal to inspection and palpation ?Lungs: clear to auscultation bilaterally ?Cardiovascular: regular rate and rhythm ?Breasts:  normal appearance, no masses, mildly tender bilaterally ?Abdomen: soft, non-tender; non distended,  no masses,  no organomegaly ?Extremities: extremities normal, atraumatic, no cyanosis or edema ?Skin: Skin color, texture, turgor normal. No rashes or lesions ?Lymph nodes: Cervical, supraclavicular, and axillary nodes normal. ?No abnormal inguinal nodes palpated ?Neurologic: Grossly normal ?Pelvic: deferred ? ?1. Well woman exam ?No pap until she is 22 ? ?2. Abnormal thyroid function test ?Last year she had normal TSH but + thyroid peroxidase antibody ?- Thyroid Panel With TSH ?- Thyroid peroxidase antibody ? ?3. Breast tenderness ?Discussed avoiding caffeine, having a well fitting bra, use of ice, use of NSAID's ?- Pregnancy, urine ? ?4. Amenorrhea ?No cycles on OCP's ?- Pregnancy, urine ? ?5. Encounter for surveillance of contraceptive pills ?- Norethindrone-Ethinyl Estradiol-Fe Biphas (  LO LOESTRIN FE) 1 MG-10 MCG / 10 MCG tablet; Take 1 tablet by mouth daily.  Dispense: 84 tablet; Refill: 3 ? ?6. Screening examination for STD (sexually transmitted disease) ?Declines blood work ?- SURESWAB CT/NG/T. vaginalis ? ? ?

## 2022-04-26 ENCOUNTER — Encounter: Payer: Self-pay | Admitting: Obstetrics and Gynecology

## 2022-04-26 ENCOUNTER — Ambulatory Visit (INDEPENDENT_AMBULATORY_CARE_PROVIDER_SITE_OTHER): Payer: BC Managed Care – PPO | Admitting: Obstetrics and Gynecology

## 2022-04-26 VITALS — BP 116/68 | HR 85 | Ht 61.0 in | Wt 121.0 lb

## 2022-04-26 DIAGNOSIS — N644 Mastodynia: Secondary | ICD-10-CM | POA: Diagnosis not present

## 2022-04-26 DIAGNOSIS — Z3041 Encounter for surveillance of contraceptive pills: Secondary | ICD-10-CM

## 2022-04-26 DIAGNOSIS — Z01419 Encounter for gynecological examination (general) (routine) without abnormal findings: Secondary | ICD-10-CM | POA: Diagnosis not present

## 2022-04-26 DIAGNOSIS — Z113 Encounter for screening for infections with a predominantly sexual mode of transmission: Secondary | ICD-10-CM

## 2022-04-26 DIAGNOSIS — R946 Abnormal results of thyroid function studies: Secondary | ICD-10-CM | POA: Diagnosis not present

## 2022-04-26 DIAGNOSIS — N912 Amenorrhea, unspecified: Secondary | ICD-10-CM | POA: Diagnosis not present

## 2022-04-26 LAB — PREGNANCY, URINE: Preg Test, Ur: NEGATIVE

## 2022-04-26 MED ORDER — LO LOESTRIN FE 1 MG-10 MCG / 10 MCG PO TABS: 1.0000 | ORAL_TABLET | Freq: Every day | ORAL | 3 refills | Status: DC

## 2022-04-26 NOTE — Patient Instructions (Signed)
EXERCISE   We recommended that you start or continue a regular exercise program for good health. Physical activity is anything that gets your body moving, some is better than none. The CDC recommends 150 minutes per week of Moderate-Intensity Aerobic Activity and 2 or more days of Muscle Strengthening Activity. ? ?Benefits of exercise are limitless: helps weight loss/weight maintenance, improves mood and energy, helps with depression and anxiety, improves sleep, tones and strengthens muscles, improves balance, improves bone density, protects from chronic conditions such as heart disease, high blood pressure and diabetes and so much more. ?To learn more visit: WhyNotPoker.uy ? ?DIET: Good nutrition starts with a healthy diet of fruits, vegetables, whole grains, and lean protein sources. Drink plenty of water for hydration. Minimize empty calories, sodium, sweets. For more information about dietary recommendations visit: GeekRegister.com.ee and http://schaefer-mitchell.com/ ? ?ALCOHOL:  Women should limit their alcohol intake to no more than 7 drinks/beers/glasses of wine (combined, not each!) per week. Moderation of alcohol intake to this level decreases your risk of breast cancer and liver damage.  If you are concerned that you may have a problem, or your friends have told you they are concerned about your drinking, there are many resources to help. A well-known program that is free, effective, and available to all people all over the nation is Alcoholics Anonymous.  Check out this site to learn more: BlockTaxes.se ? ? ?CALCIUM AND VITAMIN D:  Adequate intake of calcium and Vitamin D are recommended for bone health.  You should be getting between 1000-1200 mg of calcium and 800 units of Vitamin D daily between diet and supplements ? ?PAP SMEARS:  Pap smears, to check for cervical cancer or precancers,  have traditionally been  done yearly, scientific advances have shown that most women can have pap smears less often.  However, every woman still should have a physical exam from her gynecologist every year. It will include a breast check, inspection of the vulva and vagina to check for abnormal growths or skin changes, a visual exam of the cervix, and then an exam to evaluate the size and shape of the uterus and ovaries. We will also provide age appropriate advice regarding health maintenance, like when you should have certain vaccines, screening for sexually transmitted diseases, bone density testing, colonoscopy, mammograms, etc.  ? ?MAMMOGRAMS:  All women over 82 years old should have a routine mammogram.  ? ?COLON CANCER SCREENING: Now recommend starting at age 60. At this time colonoscopy is not covered for routine screening until 50. There are take home tests that can be done between 45-49.  ? ?COLONOSCOPY:  Colonoscopy to screen for colon cancer is recommended for all women at age 27.  We know, you hate the idea of the prep.  We agree, BUT, having colon cancer and not knowing it is worse!!  Colon cancer so often starts as a polyp that can be seen and removed at colonscopy, which can quite literally save your life!  And if your first colonoscopy is normal and you have no family history of colon cancer, most women don't have to have it again for 10 years.  Once every ten years, you can do something that may end up saving your life, right?  We will be happy to help you get it scheduled when you are ready.  Be sure to check your insurance coverage so you understand how much it will cost.  It may be covered as a preventative service at no cost, but you should check  your particular policy.   ? ? ? ?Breast Self-Awareness ?Breast self-awareness means being familiar with how your breasts look and feel. It involves checking your breasts regularly and reporting any changes to your health care provider. ?Practicing breast self-awareness is  important. A change in your breasts can be a sign of a serious medical problem. Being familiar with how your breasts look and feel allows you to find any problems early, when treatment is more likely to be successful. All women should practice breast self-awareness, including women who have had breast implants. ?How to do a breast self-exam ?One way to learn what is normal for your breasts and whether your breasts are changing is to do a breast self-exam. To do a breast self-exam: ?Look for Changes ? ?Remove all the clothing above your waist. ?Stand in front of a mirror in a room with good lighting. ?Put your hands on your hips. ?Push your hands firmly downward. ?Compare your breasts in the mirror. Look for differences between them (asymmetry), such as: ?Differences in shape. ?Differences in size. ?Puckers, dips, and bumps in one breast and not the other. ?Look at each breast for changes in your skin, such as: ?Redness. ?Scaly areas. ?Look for changes in your nipples, such as: ?Discharge. ?Bleeding. ?Dimpling. ?Redness. ?A change in position. ?Feel for Changes ?Carefully feel your breasts for lumps and changes. It is best to do this while lying on your back on the floor and again while sitting or standing in the shower or tub with soapy water on your skin. Feel each breast in the following way: ?Place the arm on the side of the breast you are examining above your head. ?Feel your breast with the other hand. ?Start in the nipple area and make ? inch (2 cm) overlapping circles to feel your breast. Use the pads of your three middle fingers to do this. Apply light pressure, then medium pressure, then firm pressure. The light pressure will allow you to feel the tissue closest to the skin. The medium pressure will allow you to feel the tissue that is a little deeper. The firm pressure will allow you to feel the tissue close to the ribs. ?Continue the overlapping circles, moving downward over the breast until you feel your  ribs below your breast. ?Move one finger-width toward the center of the body. Continue to use the ? inch (2 cm) overlapping circles to feel your breast as you move slowly up toward your collarbone. ?Continue the up and down exam using all three pressures until you reach your armpit. ? ?Write Down What You Find ? ?Write down what is normal for each breast and any changes that you find. Keep a written record with breast changes or normal findings for each breast. By writing this information down, you do not need to depend only on memory for size, tenderness, or location. Write down where you are in your menstrual cycle, if you are still menstruating. ?If you are having trouble noticing differences in your breasts, do not get discouraged. With time you will become more familiar with the variations in your breasts and more comfortable with the exam. ?How often should I examine my breasts? ?Examine your breasts every month. If you are breastfeeding, the best time to examine your breasts is after a feeding or after using a breast pump. If you menstruate, the best time to examine your breasts is 5-7 days after your period is over. During your period, your breasts are lumpier, and it may be more  difficult to notice changes. ?When should I see my health care provider? ?See your health care provider if you notice: ?A change in shape or size of your breasts or nipples. ?A change in the skin of your breast or nipples, such as a reddened or scaly area. ?Unusual discharge from your nipples. ?A lump or thick area that was not there before. ?Pain in your breasts. ?Anything that concerns you. ?Breast Tenderness ?Breast tenderness is a common problem for women of all ages, but may also occur in men. Breast tenderness may range from mild discomfort to severe pain. In women, the pain usually comes and goes with the menstrual cycle, but it can also be constant. ?Breast tenderness has many possible causes, including hormone changes,  infections, and taking certain medicines. You may have tests, such as a mammogram or an ultrasound, to check for any unusual findings. Having breast tenderness usually does not mean that you have breast cancer.

## 2022-04-27 ENCOUNTER — Encounter: Payer: Self-pay | Admitting: Obstetrics and Gynecology

## 2022-04-27 LAB — SURESWAB CT/NG/T. VAGINALIS
C. trachomatis RNA, TMA: NOT DETECTED
N. gonorrhoeae RNA, TMA: NOT DETECTED
Trichomonas vaginalis RNA: NOT DETECTED

## 2022-04-28 LAB — THYROID PANEL WITH TSH
Free Thyroxine Index: 2.6 (ref 1.4–3.8)
T3 Uptake: 23 % (ref 22–35)
T4, Total: 11.3 ug/dL (ref 5.3–11.7)
TSH: 3.23 mIU/L

## 2022-04-28 LAB — THYROID PEROXIDASE ANTIBODY: Thyroperoxidase Ab SerPl-aCnc: 67 IU/mL — ABNORMAL HIGH (ref <9)

## 2022-04-30 ENCOUNTER — Telehealth: Payer: Self-pay | Admitting: *Deleted

## 2022-04-30 NOTE — Telephone Encounter (Signed)
Patient called and left message in triage voicemail that thyroid antibody was released to my chart. Patient wanted to know what to do next. Dr.Jertson is out of the office today once she returns and reviews results we will reach out to her. Patient verbalized she understood.

## 2022-09-03 DIAGNOSIS — J398 Other specified diseases of upper respiratory tract: Secondary | ICD-10-CM | POA: Insufficient documentation

## 2023-04-29 ENCOUNTER — Encounter: Payer: Self-pay | Admitting: Obstetrics and Gynecology

## 2023-04-29 ENCOUNTER — Other Ambulatory Visit (HOSPITAL_COMMUNITY)
Admission: RE | Admit: 2023-04-29 | Discharge: 2023-04-29 | Disposition: A | Discharge status: Home / Self Care | Payer: BC Managed Care – PPO | Source: Ambulatory Visit | Attending: Obstetrics and Gynecology | Admitting: Obstetrics and Gynecology

## 2023-04-29 ENCOUNTER — Ambulatory Visit (INDEPENDENT_AMBULATORY_CARE_PROVIDER_SITE_OTHER): Payer: BC Managed Care – PPO | Admitting: Obstetrics and Gynecology

## 2023-04-29 VITALS — BP 106/64 | HR 68 | Resp 18 | Ht 61.61 in | Wt 112.4 lb

## 2023-04-29 DIAGNOSIS — Z23 Encounter for immunization: Secondary | ICD-10-CM | POA: Diagnosis not present

## 2023-04-29 DIAGNOSIS — Z01419 Encounter for gynecological examination (general) (routine) without abnormal findings: Secondary | ICD-10-CM

## 2023-04-29 DIAGNOSIS — Z124 Encounter for screening for malignant neoplasm of cervix: Secondary | ICD-10-CM | POA: Diagnosis present

## 2023-04-29 DIAGNOSIS — Z113 Encounter for screening for infections with a predominantly sexual mode of transmission: Secondary | ICD-10-CM | POA: Diagnosis present

## 2023-04-29 DIAGNOSIS — R4586 Emotional lability: Secondary | ICD-10-CM

## 2023-04-29 DIAGNOSIS — R946 Abnormal results of thyroid function studies: Secondary | ICD-10-CM

## 2023-04-29 DIAGNOSIS — F419 Anxiety disorder, unspecified: Secondary | ICD-10-CM

## 2023-04-29 NOTE — Progress Notes (Signed)
22 y.o. G0P0000 Single White or Caucasian Not Hispanic or Latino female here for annual exam.   Period Cycle (Days):  (No menses on OCP) She was having headaches daily in March. Tylenol sinus is the only thing that helped. No auras. She has been under stress, graduated from college, moved to [place], working full time. Falling out with her best friend.  Feeling anxious and down for the last few months  Just broke up with her boyfriend as well.   No LMP recorded. (Menstrual status: Oral contraceptives).          Sexually active: Yes.    The current method of family planning is OCP (estrogen/progesterone).    Exercising: Yes.     Walks and weight lifting twice a week Smoker:  no  Health Maintenance: Pap:  Never History of abnormal Pap:  no MMG:  N/A BMD:   N/A Colonoscopy: N/A TDaP:  06/21/2013 Gardasil: Completed   reports that she has never smoked. She has never used smokeless tobacco. She reports that she does not drink alcohol and does not use drugs. Working as a Sales executive.   Past Medical History:  Diagnosis Date   Allergic rhinitis    Migraines     Past Surgical History:  Procedure Laterality Date   TYMPANOPLASTY     replacement x 2   TYMPANOSTOMY TUBE PLACEMENT     UPPER GI ENDOSCOPY     for acid reflux   WISDOM TOOTH EXTRACTION      Current Outpatient Medications  Medication Sig Dispense Refill   Norethindrone-Ethinyl Estradiol-Fe Biphas (LO LOESTRIN FE) 1 MG-10 MCG / 10 MCG tablet Take 1 tablet by mouth daily. 84 tablet 3   No current facility-administered medications for this visit.    Family History  Problem Relation Age of Onset   Hyperthyroidism Mother    Other Father        substance abuse   Osteoporosis Maternal Grandmother    Hypertension Maternal Grandmother    Diabetes Paternal Grandmother    Pancreatitis Paternal Grandmother     Review of Systems  Neurological:  Positive for headaches.    Exam:   BP 106/64 (BP Location: Right  Arm, Patient Position: Sitting)   Pulse 68   Resp 18   Ht 5' 1.61" (1.565 m)   Wt 112 lb 6.4 oz (51 kg)   SpO2 98%   BMI 20.82 kg/m   Weight change: @WEIGHTCHANGE @ Height:   Height: 5' 1.61" (156.5 cm)  Ht Readings from Last 3 Encounters:  04/29/23 5' 1.61" (1.565 m)  04/26/22 5\' 1"  (1.549 m) (10 %, Z= -1.29)*  07/01/21 5\' 1"  (1.549 m) (10 %, Z= -1.28)*   * Growth percentiles are based on CDC (Girls, 2-20 Years) data.    General appearance: alert, cooperative and appears stated age Head: Normocephalic, without obvious abnormality, atraumatic Neck: no adenopathy, supple, symmetrical, trachea midline and thyroid normal to inspection and palpation Lungs: clear to auscultation bilaterally Cardiovascular: regular rate and rhythm Breasts: normal appearance, no masses or tenderness Abdomen: soft, non-tender; non distended,  no masses,  no organomegaly Extremities: extremities normal, atraumatic, no cyanosis or edema Skin: Skin color, texture, turgor normal. No rashes or lesions Lymph nodes: Cervical, supraclavicular, and axillary nodes normal. No abnormal inguinal nodes palpated Neurologic: Grossly normal   Pelvic: External genitalia:  no lesions              Urethra:  normal appearing urethra with no masses, tenderness or lesions  Bartholins and Skenes: normal                 Vagina: normal appearing vagina with normal color and discharge, no lesions              Cervix: no lesions               Bimanual Exam:  Uterus:  normal size, contour, position, consistency, mobility, non-tender              Adnexa: no mass, fullness, tenderness               Rectovaginal: Confirms               Anus:  normal sphincter tone, no lesions  Cornelia Copa, CMA chaperoned for the exam.  1. Well woman exam Discussed breast self exam Discussed calcium and vit D intake  2. Abnormal thyroid function test - TSH  3. Screening for cervical cancer - Cytology - PAP  4.  Anxiety Discussed options of counseling and SSRI. She will reach out if she wants to start a SSRI.   5. Mood changes See above  6. Screening examination for STD (sexually transmitted disease) - RPR - HIV Antibody (routine testing w rflx) - Hepatitis C antibody - Cytology - PAP  7. Immunization due - Tdap vaccine greater than or equal to 7yo IM

## 2023-04-29 NOTE — Patient Instructions (Addendum)
EXERCISE   We recommended that you start or continue a regular exercise program for good health. Physical activity is anything that gets your body moving, some is better than none. The CDC recommends 150 minutes per week of Moderate-Intensity Aerobic Activity and 2 or more days of Muscle Strengthening Activity.  Benefits of exercise are limitless: helps weight loss/weight maintenance, improves mood and energy, helps with depression and anxiety, improves sleep, tones and strengthens muscles, improves balance, improves bone density, protects from chronic conditions such as heart disease, high blood pressure and diabetes and so much more. To learn more visit: [email or web address]-[email or web address]  DIET: Good nutrition starts with a healthy diet of fruits, vegetables, whole grains, and lean protein sources. Drink plenty of water for hydration. Minimize empty calories, sodium, sweets. For more information about dietary recommendations visit: CriticalGas.be and [email or web address].carpenter-henry.[email or web address]  ALCOHOL:  Women should limit their alcohol intake to no more than 7 drinks/beers/glasses of wine (combined, not each!) per week. Moderation of alcohol intake to this level decreases your risk of breast cancer and liver damage.  If you are concerned that you may have a problem, or your friends have told you they are concerned about your drinking, there are many resources to help. A well-known program that is free, effective, and available to all people all over the nation is Alcoholics Anonymous.  Check out this site to learn more: BeverageBargains.co.za   CALCIUM AND VITAMIN D:  Adequate intake of calcium and Vitamin D are recommended for bone health.  You should be getting between 1000-1200 mg of calcium and 800 units of Vitamin D daily between diet and supplements  PAP SMEARS:  Pap smears, to check for cervical cancer or precancers,  have traditionally been  done yearly, scientific advances have shown that most women can have pap smears less often.  However, every woman still should have a physical exam from her gynecologist every year. It will include a breast check, inspection of the vulva and vagina to check for abnormal growths or skin changes, a visual exam of the cervix, and then an exam to evaluate the size and shape of the uterus and ovaries. We will also provide age appropriate advice regarding health maintenance, like when you should have certain vaccines, screening for sexually transmitted diseases, bone density testing, colonoscopy, mammograms, etc.   MAMMOGRAMS:  All women over 4 years old should have a routine mammogram.   COLON CANCER SCREENING: Now recommend starting at age 73. At this time colonoscopy is not covered for routine screening until 50. There are take home tests that can be done between 45-49.   COLONOSCOPY:  Colonoscopy to screen for colon cancer is recommended for all women at age 60.  We know, you hate the idea of the prep.  We agree, BUT, having colon cancer and not knowing it is worse!!  Colon cancer so often starts as a polyp that can be seen and removed at colonscopy, which can quite literally save your life!  And if your first colonoscopy is normal and you have no family history of colon cancer, most women don't have to have it again for 10 years.  Once every ten years, you can do something that may end up saving your life, right?  We will be happy to help you get it scheduled when you are ready.  Be sure to check your insurance coverage so you understand how much it will cost.  It may be covered as a preventative service at no cost, but you should check  your particular policy.      Breast Self-Awareness Breast self-awareness means being familiar with how your breasts look and feel. It involves checking your breasts regularly and reporting any changes to your health care provider. Practicing breast self-awareness is  important. A change in your breasts can be a sign of a serious medical problem. Being familiar with how your breasts look and feel allows you to find any problems early, when treatment is more likely to be successful. All women should practice breast self-awareness, including women who have had breast implants. How to do a breast self-exam One way to learn what is normal for your breasts and whether your breasts are changing is to do a breast self-exam. To do a breast self-exam: Look for Changes  Remove all the clothing above your waist. Stand in front of a mirror in a room with good lighting. Put your hands on your hips. Push your hands firmly downward. Compare your breasts in the mirror. Look for differences between them (asymmetry), such as: Differences in shape. Differences in size. Puckers, dips, and bumps in one breast and not the other. Look at each breast for changes in your skin, such as: Redness. Scaly areas. Look for changes in your nipples, such as: Discharge. Bleeding. Dimpling. Redness. A change in position. Feel for Changes Carefully feel your breasts for lumps and changes. It is best to do this while lying on your back on the floor and again while sitting or standing in the shower or tub with soapy water on your skin. Feel each breast in the following way: Place the arm on the side of the breast you are examining above your head. Feel your breast with the other hand. Start in the nipple area and make  inch (2 cm) overlapping circles to feel your breast. Use the pads of your three middle fingers to do this. Apply light pressure, then medium pressure, then firm pressure. The light pressure will allow you to feel the tissue closest to the skin. The medium pressure will allow you to feel the tissue that is a little deeper. The firm pressure will allow you to feel the tissue close to the ribs. Continue the overlapping circles, moving downward over the breast until you feel your  ribs below your breast. Move one finger-width toward the center of the body. Continue to use the  inch (2 cm) overlapping circles to feel your breast as you move slowly up toward your collarbone. Continue the up and down exam using all three pressures until you reach your armpit.  Write Down What You Find  Write down what is normal for each breast and any changes that you find. Keep a written record with breast changes or normal findings for each breast. By writing this information down, you do not need to depend only on memory for size, tenderness, or location. Write down where you are in your menstrual cycle, if you are still menstruating. If you are having trouble noticing differences in your breasts, do not get discouraged. With time you will become more familiar with the variations in your breasts and more comfortable with the exam. How often should I examine my breasts? Examine your breasts every month. If you are breastfeeding, the best time to examine your breasts is after a feeding or after using a breast pump. If you menstruate, the best time to examine your breasts is 5-7 days after your period is over. During your period, your breasts are lumpier, and it may be more  difficult to notice changes. When should I see my health care provider? See your health care provider if you notice: A change in shape or size of your breasts or nipples. A change in the skin of your breast or nipples, such as a reddened or scaly area. Unusual discharge from your nipples. A lump or thick area that was not there before. Pain in your breasts. Anything that concerns you. Managing Depression, Adult Depression is a mental health condition that affects your thoughts, feelings, and actions. Being diagnosed with depression can bring you relief if you did not know why you have felt or behaved a certain way. It could also leave you feeling overwhelmed. Finding ways to manage your symptoms can help you feel more  positive about your future. How to manage lifestyle changes Being depressed is difficult. Depression can increase the level of everyday stress. Stress can make depression symptoms worse. You may believe your symptoms cannot be managed or will never improve. However, there are many things you can try to help manage your symptoms. There is hope. Managing stress  Stress is your body's reaction to life changes and events, both good and bad. Stress can add to your feelings of depression. Learning to manage your stress can help lessen your feelings of depression. Try some of the following approaches to reducing your stress (stress reduction techniques): Listen to music that you enjoy and that inspires you. Try using a meditation app or take a meditation class. Develop a practice that helps you connect with your spiritual self. Walk in nature, pray, or go to a place of worship. Practice deep breathing. To do this, inhale slowly through your nose. Pause at the top of your inhale for a few seconds and then exhale slowly, letting yourself relax. Repeat this three or four times. Practice yoga to help relax and work your muscles. Choose a stress reduction technique that works for you. These techniques take time and practice to develop. Set aside 5-15 minutes a day to do them. Therapists can offer training in these techniques. Do these things to help manage stress: Keep a journal. Know your limits. Set healthy boundaries for yourself and others, such as saying "no" when you think something is too much. Pay attention to how you react to certain situations. You may not be able to control everything, but you can change your reaction. Add humor to your life by watching funny movies or shows. Make time for activities that you enjoy and that relax you. Spend less time using electronics, especially at night before bed. The light from screens can make your brain think it is time to get up rather than go to  bed.  Medicines Medicines, such as antidepressants, are often a part of treatment for depression. Talk with your pharmacist or health care provider about all the medicines, supplements, and herbal products that you take, their possible side effects, and what medicines and other products are safe to take together. Make sure to report any side effects you may have to your health care provider. Relationships Your health care provider may suggest family therapy, couples therapy, or individual therapy as part of your treatment. How to recognize changes Everyone responds differently to treatment for depression. As you recover from depression, you may start to: Have more interest in doing activities. Feel more hopeful. Have more energy. Eat a more regular amount of food. Have better mental focus. It is important to recognize if your depression is not getting better or is getting worse.  The symptoms you had in the beginning may return, such as: Feeling tired. Eating too much or too little. Sleeping too much or too little. Feeling restless, agitated, or hopeless. Trouble focusing or making decisions. Having unexplained aches and pains. Feeling irritable, angry, or aggressive. If you or your family members notice these symptoms coming back, let your health care provider know right away. Follow these instructions at home: Activity Try to get some form of exercise each day, such as walking. Try yoga, mindfulness, or other stress reduction techniques. Participate in group activities if you are able. Lifestyle Get enough sleep. Cut down on or stop using caffeine, tobacco, alcohol, and any other harmful substances. Eat a healthy diet that includes plenty of vegetables, fruits, whole grains, low-fat dairy products, and lean protein. Limit foods that are high in solid fats, added sugar, or salt (sodium). General instructions Take over-the-counter and prescription medicines only as told by your  health care provider. Keep all follow-up visits. It is important for your health care provider to check on your mood, behavior, and medicines. Your health care provider may need to make changes to your treatment. Where to find support Talking to others  Friends and family members can be sources of support and guidance. Talk to trusted friends or family members about your condition. Explain your symptoms and let them know that you are working with a health care provider to treat your depression. Tell friends and family how they can help. Finances Find mental health providers that fit with your financial situation. Talk with your health care provider if you are worried about access to food, housing, or medicine. Call your insurance company to learn about your co-pays and prescription plan. Where to find more information You can find support in your area from: Anxiety and Depression Association of America (ADAA): adaa.org Mental Health America: mentalhealthamerica.net The First American on Mental Illness: nami.org Contact a health care provider if: You stop taking your antidepressant medicines, and you have any of these symptoms: Nausea. Headache. Light-headedness. Chills and body aches. Not being able to sleep (insomnia). You or your friends and family think your depression is getting worse. Get help right away if: You have thoughts of hurting yourself or others. Get help right away if you feel like you may hurt yourself or others, or have thoughts about taking your own life. Go to your nearest emergency room or: Call 911. Call the National Suicide Prevention Lifeline at 7266058515 or 988. This is open 24 hours a day. Text the Crisis Text Line at (562)784-2599. This information is not intended to replace advice given to you by your health care provider. Make sure you discuss any questions you have with your health care provider. Document Revised: 04/06/2022 Document Reviewed:  04/06/2022 Elsevier Patient Education  2023 Elsevier Inc. Managing Anxiety, Adult After being diagnosed with anxiety, you may be relieved to know why you have felt or behaved a certain way. You may also feel overwhelmed about the treatment ahead and what it will mean for your life. With care and support, you can manage your anxiety. How to manage lifestyle changes Understanding the difference between stress and anxiety Although stress can play a role in anxiety, it is not the same as anxiety. Stress is your body's reaction to life changes and events, both good and bad. Stress is often caused by something external, such as a deadline, test, or competition. It normally goes away after the event has ended and will last just a few hours. But, stress  can be ongoing and can lead to more than just stress. Anxiety is caused by something internal, such as imagining a terrible outcome or worrying that something will go wrong that will greatly upset you. Anxiety often does not go away even after the event is over, and it can become a long-term (chronic) worry. Lowering stress and anxiety Talk with your health care provider or a counselor to learn more about lowering anxiety and stress. They may suggest tension-reduction techniques, such as: Music. Spend time creating or listening to music that you enjoy and that inspires you. Mindfulness-based meditation. Practice being aware of your normal breaths while not trying to control your breathing. It can be done while sitting or walking. Centering prayer. Focus on a word, phrase, or sacred image that means something to you and brings you peace. Deep breathing. Expand your stomach and inhale slowly through your nose. Hold your breath for 3-5 seconds. Then breathe out slowly, letting your stomach muscles relax. Self-talk. Learn to notice and spot thought patterns that lead to anxiety reactions. Change those patterns to thoughts that feel peaceful. Muscle relaxation.  Take time to tense muscles and then relax them. Choose a tension-reduction technique that fits your lifestyle and personality. These techniques take time and practice. Set aside 5-15 minutes a day to do them. Specialized therapists can offer counseling and training in these techniques. The training to help with anxiety may be covered by some insurance plans. Other things you can do to manage stress and anxiety include: Keeping a stress diary. This can help you learn what triggers your reaction and then learn ways to manage your response. Thinking about how you react to certain situations. You may not be able to control everything, but you can control your response. Making time for activities that help you relax and not feeling guilty about spending your time in this way. Doing visual imagery. This involves imagining or creating mental pictures to help you relax. Practicing yoga. Through yoga poses, you can lower tension and relax.  Medicines Medicines for anxiety include: Antidepressant medicines. These are usually prescribed for long-term daily control. Anti-anxiety medicines. These may be added in severe cases, especially when panic attacks occur. When used together, medicines, psychotherapy, and tension-reduction techniques may be the most effective treatment. Relationships Relationships can play a big part in helping you recover. Spend more time connecting with trusted friends and family members. Think about going to couples counseling if you have a partner, taking family education classes, or going to family therapy. Therapy can help you and others better understand your anxiety. How to recognize changes in your anxiety Everyone responds differently to treatment for anxiety. Recovery from anxiety happens when symptoms lessen and stop interfering with your daily life at home or work. This may mean that you will start to: Have better concentration and focus. Worry will interfere less in your  daily thinking. Sleep better. Be less irritable. Have more energy. Have improved memory. Try to recognize when your condition is getting worse. Contact your provider if your symptoms interfere with home or work and you feel like your condition is not improving. Follow these instructions at home: Activity Exercise. Adults should: Exercise for at least 150 minutes each week. The exercise should increase your heart rate and make you sweat (moderate-intensity exercise). Do strengthening exercises at least twice a week. Get the right amount and quality of sleep. Most adults need 7-9 hours of sleep each night. Lifestyle  Eat a healthy diet that includes plenty of  vegetables, fruits, whole grains, low-fat dairy products, and lean protein. Do not eat a lot of foods that are high in fats, added sugars, or salt (sodium). Make choices that simplify your life. Do not use any products that contain nicotine or tobacco. These products include cigarettes, chewing tobacco, and vaping devices, such as e-cigarettes. If you need help quitting, ask your provider. Avoid caffeine, alcohol, and certain over-the-counter cold medicines. These may make you feel worse. Ask your pharmacist which medicines to avoid. General instructions Take over-the-counter and prescription medicines only as told by your provider. Keep all follow-up visits. This is to make sure you are managing your anxiety well or if you need more support. Where to find support You can get help and support from: Self-help groups. Online and [employer]. A trusted spiritual leader. Couples counseling. Family education classes. Family therapy. Where to find more information You may find that joining a support group helps you deal with your anxiety. The following sources can help you find counselors or support groups near you: Mental Health America: mentalhealthamerica.net Anxiety and Depression Association of [place] (ADAA):  adaa.org The First American on Mental Illness (NAMI): nami.org Contact a health care provider if: You have a hard time staying focused or finishing tasks. You spend many hours a day feeling worried about everyday life. You are very tired because you cannot stop worrying. You start to have headaches or often feel tense. You have chronic nausea or diarrhea. Get help right away if: Your heart feels like it is racing. You have shortness of breath. You have thoughts of hurting yourself or others. Get help right away if you feel like you may hurt yourself or others, or have thoughts about taking your own life. Go to your nearest emergency room or: Call 911. Call the National Suicide Prevention Lifeline at 970-428-6560 or 988. This is open 24 hours a day. Text the Crisis Text Line at (757)010-3170. This information is not intended to replace advice given to you by your health care provider. Make sure you discuss any questions you have with your health care provider. Document Revised: 09/07/2022 Document Reviewed: 03/22/2021 Elsevier Patient Education  2023 [employer].

## 2023-04-30 LAB — HEPATITIS C ANTIBODY: Hepatitis C Ab: NONREACTIVE

## 2023-04-30 LAB — TSH: TSH: 2.65 mIU/L

## 2023-04-30 LAB — RPR: RPR Ser Ql: NONREACTIVE

## 2023-04-30 LAB — HIV ANTIBODY (ROUTINE TESTING W REFLEX): HIV 1&2 Ab, 4th Generation: NONREACTIVE

## 2023-05-05 LAB — CYTOLOGY - PAP
Chlamydia: NEGATIVE
Comment: NEGATIVE
Comment: NEGATIVE
Comment: NORMAL
Neisseria Gonorrhea: NEGATIVE
Trichomonas: NEGATIVE

## 2023-05-10 ENCOUNTER — Encounter: Payer: Self-pay | Admitting: Obstetrics and Gynecology

## 2023-05-11 NOTE — Telephone Encounter (Signed)
Will route to provider for review and close.

## 2023-05-25 ENCOUNTER — Other Ambulatory Visit: Payer: Self-pay | Admitting: Obstetrics and Gynecology

## 2023-05-25 DIAGNOSIS — Z3041 Encounter for surveillance of contraceptive pills: Secondary | ICD-10-CM

## 2023-05-25 NOTE — Telephone Encounter (Signed)
Medication refill request: lo loestrin Last AEX:  04-29-23 Next AEX: not scheduled Last MMG (if hormonal medication request): none Refill authorized: please approve or deny as appropriate

## 2024-02-25 ENCOUNTER — Telehealth: Admitting: Nurse Practitioner

## 2024-02-25 DIAGNOSIS — J069 Acute upper respiratory infection, unspecified: Secondary | ICD-10-CM

## 2024-02-25 MED ORDER — PROMETHAZINE-DM 6.25-15 MG/5ML PO SYRP: 2.5000 mL | ORAL_SOLUTION | Freq: Four times a day (QID) | ORAL | 0 refills | Status: DC | PRN

## 2024-02-25 MED ORDER — AZELASTINE-FLUTICASONE 137-50 MCG/ACT NA SUSP: 1.0000 | Freq: Two times a day (BID) | NASAL | 0 refills | Status: DC

## 2024-02-25 MED ORDER — BENZONATATE 200 MG PO CAPS: 200.0000 mg | ORAL_CAPSULE | Freq: Two times a day (BID) | ORAL | 0 refills | Status: DC | PRN

## 2024-02-25 NOTE — Progress Notes (Signed)
 Virtual Visit Consent   Cynthia Delacruz, you are scheduled for a virtual visit with a [facility] provider today. Just as with appointments in the office, your consent must be obtained to participate. Your consent will be active for this visit and any virtual visit you may have with one of our providers in the next 365 days. If you have a MyChart account, a copy of this consent can be sent to you electronically.  As this is a virtual visit, video technology does not allow for your provider to perform a traditional examination. This may limit your provider's ability to fully assess your condition. If your provider identifies any concerns that need to be evaluated in person or the need to arrange testing (such as labs, EKG, etc.), we will make arrangements to do so. Although advances in technology are sophisticated, we cannot ensure that it will always work on either your end or our end. If the connection with a video visit is poor, the visit may have to be switched to a telephone visit. With either a video or telephone visit, we are not always able to ensure that we have a secure connection.  By engaging in this virtual visit, you consent to the provision of healthcare and authorize for your insurance to be billed (if applicable) for the services provided during this visit. Depending on your insurance coverage, you may receive a charge related to this service.  I need to obtain your verbal consent now. Are you willing to proceed with your visit today? Cynthia Delacruz has provided verbal consent on 02/25/2024 for a virtual visit (video or telephone). Claiborne Rigg, NP  Date: 02/25/2024 7:52 PM   Virtual Visit via Video Note   I, Claiborne Rigg, connected with  Cynthia Delacruz  ([id number], 01-10-2001) on 02/25/24 at  7:45 PM EDT by a video-enabled telemedicine application and verified that I am speaking with the correct person using two identifiers.  Location: Patient: Virtual Visit Location Patient:  Home Provider: Virtual Visit Location Provider: Home Office   I discussed the limitations of evaluation and management by telemedicine and the availability of in person appointments. The patient expressed understanding and agreed to proceed.    History of Present Illness: Cynthia Delacruz is a 23 y.o. who identifies as a female who was assigned female at birth, and is being seen today for viral URI with cough and congestion.  Cynthia Delacruz has been experiencing the following symptoms of cough, sore throat, runny nose and post nasal drainage over the past 2 days. Associated symptoms include sweating and chills. She is taking OTC cough syrups as prescribed with no relief of symptoms.    Problems:  Patient Active Problem List   Diagnosis Date Noted   Other specified diseases of upper respiratory tract 09/03/2022   Acute bacterial rhinosinusitis 11/11/2020   Dysmenorrhea 05/29/2018   Reflux 11/15/2011    Allergies:  Allergies  Allergen Reactions   Doxycycline Hives, Itching and Nausea And Vomiting   Cefdinir Hives    Omnicef - hives   Medications:  Current Outpatient Medications:    Azelastine-Fluticasone 137-50 MCG/ACT SUSP, Place 1 spray into the nose every 12 (twelve) hours., Disp: 23 g, Rfl: 0   benzonatate (TESSALON) 200 MG capsule, Take 1 capsule (200 mg total) by mouth 2 (two) times daily as needed for cough., Disp: 20 capsule, Rfl: 0   promethazine-dextromethorphan (PROMETHAZINE-DM) 6.25-15 MG/5ML syrup, Take 2.5 mLs by mouth 4 (four) times daily as needed for  cough., Disp: 240 mL, Rfl: 0   LO LOESTRIN FE 1 MG-10 MCG / 10 MCG tablet, TAKE 1 TABLET BY MOUTH EVERY DAY, Disp: 84 tablet, Rfl: 3  Observations/Objective: Patient is well-developed, well-nourished in no acute distress.  Resting comfortably at home.  Head is normocephalic, atraumatic.  No labored breathing.  Speech is clear and coherent with logical content.  Patient is alert and oriented at baseline.    Assessment and  Plan: 1. URI with cough and congestion (Primary) - Azelastine-Fluticasone 137-50 MCG/ACT SUSP; Place 1 spray into the nose every 12 (twelve) hours.  Dispense: 23 g; Refill: 0 - promethazine-dextromethorphan (PROMETHAZINE-DM) 6.25-15 MG/5ML syrup; Take 2.5 mLs by mouth 4 (four) times daily as needed for cough.  Dispense: 240 mL; Refill: 0 - benzonatate (TESSALON) 200 MG capsule; Take 1 capsule (200 mg total) by mouth 2 (two) times daily as needed for cough.  Dispense: 20 capsule; Refill: 0  INSTRUCTIONS: use a humidifier for nasal congestion Drink plenty of fluids, rest and wash hands frequently to avoid the spread of infection Alternate tylenol and Motrin for relief of fever   Follow Up Instructions: I discussed the assessment and treatment plan with the patient. The patient was provided an opportunity to ask questions and all were answered. The patient agreed with the plan and demonstrated an understanding of the instructions.  A copy of instructions were sent to the patient via MyChart unless otherwise noted below.    The patient was advised to call back or seek an in-person evaluation if the symptoms worsen or if the condition fails to improve as anticipated.    Claiborne Rigg, NP

## 2024-02-25 NOTE — Patient Instructions (Signed)
  Gena Fray Laban Emperor, thank you for joining Claiborne Rigg, NP for today's virtual visit.  While this provider is not your primary care provider (PCP), if your PCP is located in our provider database this encounter information will be shared with them immediately following your visit.   A [facility] MyChart account gives you access to today's visit and all your visits, tests, and labs performed at [facility] " click here if you don't have a [facility] MyChart account or go to [email or web address]  Consent: (Patient) Cynthia Delacruz provided verbal consent for this virtual visit at the beginning of the encounter.  Current Medications:  Current Outpatient Medications:    Azelastine-Fluticasone 137-50 MCG/ACT SUSP, Place 1 spray into the nose every 12 (twelve) hours., Disp: 23 g, Rfl: 0   benzonatate (TESSALON) 200 MG capsule, Take 1 capsule (200 mg total) by mouth 2 (two) times daily as needed for cough., Disp: 20 capsule, Rfl: 0   promethazine-dextromethorphan (PROMETHAZINE-DM) 6.25-15 MG/5ML syrup, Take 2.5 mLs by mouth 4 (four) times daily as needed for cough., Disp: 240 mL, Rfl: 0   LO LOESTRIN FE 1 MG-10 MCG / 10 MCG tablet, TAKE 1 TABLET BY MOUTH EVERY DAY, Disp: 84 tablet, Rfl: 3   Medications ordered in this encounter:  Meds ordered this encounter  Medications   Azelastine-Fluticasone 137-50 MCG/ACT SUSP    Sig: Place 1 spray into the nose every 12 (twelve) hours.    Dispense:  23 g    Refill:  0    Supervising Provider:   Merrilee Jansky [[id number]]   promethazine-dextromethorphan (PROMETHAZINE-DM) 6.25-15 MG/5ML syrup    Sig: Take 2.5 mLs by mouth 4 (four) times daily as needed for cough.    Dispense:  240 mL    Refill:  0    Supervising Provider:   Merrilee Jansky [[id number]]   benzonatate (TESSALON) 200 MG capsule    Sig: Take 1 capsule (200 mg total) by mouth 2 (two) times daily as needed for cough.    Dispense:  20 capsule    Refill:  0    Supervising  Provider:   Merrilee Jansky [[id number]]     *If you need refills on other medications prior to your next appointment, please contact your pharmacy*  Follow-Up: Call back or seek an in-person evaluation if the symptoms worsen or if the condition fails to improve as anticipated.  [facility] [phone]  Other Instructions INSTRUCTIONS: use a humidifier for nasal congestion Drink plenty of fluids, rest and wash hands frequently to avoid the spread of infection Alternate tylenol and Motrin for relief of fever    If you have been instructed to have an in-person evaluation today at a local Urgent Care facility, please use the link below. It will take you to a list of all of our available [facility], including address, phone number and hours of operation. Please do not delay care.  [facility]  If you or a family member do not have a primary care provider, use the link below to schedule a visit and establish care. When you choose a [facility] primary care physician or advanced practice provider, you gain a long-term partner in health. Find a Primary Care Provider  Learn more about [facility]'s in-office and virtual care options: [facility] - Get Care Now

## 2024-02-29 ENCOUNTER — Encounter: Payer: Self-pay | Admitting: Nurse Practitioner

## 2024-04-25 ENCOUNTER — Other Ambulatory Visit: Payer: Self-pay

## 2024-04-25 ENCOUNTER — Other Ambulatory Visit: Payer: Self-pay | Admitting: Obstetrics and Gynecology

## 2024-04-25 DIAGNOSIS — Z3041 Encounter for surveillance of contraceptive pills: Secondary | ICD-10-CM

## 2024-04-25 MED ORDER — LO LOESTRIN FE 1 MG-10 MCG / 10 MCG PO TABS: 1.0000 | ORAL_TABLET | Freq: Every day | ORAL | 0 refills | Status: DC

## 2024-04-25 NOTE — Telephone Encounter (Signed)
 Pt LVM in triage line stating that today and tomorrow are her last days of last pack of BCPs and is requesting refill to be [facility] to [facility].   Med refill request: BCPs Last AEX: 04/29/2023-JJ Next AEX: nothing scheduled, msg sent to appt desk to reach out.  Last MMG (if hormonal med): n/a Refill authorized: rx pend.

## 2024-04-26 NOTE — Telephone Encounter (Signed)
 Medication refill request: lo lestrin fe  Refill authorized: rx was sent in yesterday. This rx denied

## 2024-04-30 ENCOUNTER — Encounter: Payer: Self-pay | Admitting: Obstetrics and Gynecology

## 2024-05-11 NOTE — Telephone Encounter (Signed)
 Received on 5/282025: "Rosalynd Combs, CMA Message was left for patient to call and schedule appointment/ patient has not called back."

## 2024-07-02 ENCOUNTER — Ambulatory Visit (INDEPENDENT_AMBULATORY_CARE_PROVIDER_SITE_OTHER): Payer: Self-pay | Admitting: Radiology

## 2024-07-02 ENCOUNTER — Encounter: Payer: Self-pay | Admitting: Radiology

## 2024-07-02 ENCOUNTER — Other Ambulatory Visit (HOSPITAL_COMMUNITY)
Admission: RE | Admit: 2024-07-02 | Discharge: 2024-07-02 | Disposition: A | Discharge status: Home / Self Care | Source: Ambulatory Visit | Attending: Radiology | Admitting: Radiology

## 2024-07-02 VITALS — BP 128/82 | HR 112 | Ht 61.81 in | Wt 110.0 lb

## 2024-07-02 DIAGNOSIS — R87612 Low grade squamous intraepithelial lesion on cytologic smear of cervix (LGSIL): Secondary | ICD-10-CM | POA: Diagnosis not present

## 2024-07-02 DIAGNOSIS — Z01419 Encounter for gynecological examination (general) (routine) without abnormal findings: Secondary | ICD-10-CM | POA: Diagnosis not present

## 2024-07-02 DIAGNOSIS — Z1331 Encounter for screening for depression: Secondary | ICD-10-CM | POA: Diagnosis not present

## 2024-07-02 DIAGNOSIS — Z3041 Encounter for surveillance of contraceptive pills: Secondary | ICD-10-CM

## 2024-07-02 MED ORDER — LO LOESTRIN FE 1 MG-10 MCG / 10 MCG PO TABS: 1.0000 | ORAL_TABLET | Freq: Every day | ORAL | 4 refills | Status: AC

## 2024-07-02 NOTE — Progress Notes (Signed)
 Cynthia Delacruz 2001-09-25 [id number]   History:  23 y.o. G0 presents for annual exam.No gyn concerns. Happy with OCPs, same sexual partner. LSIL pap 2024.   Gynecologic History No LMP recorded. (Menstrual status: Oral contraceptives).   Contraception/Family planning: OCP (estrogen/progesterone) Sexually active: yes Last Pap: 5/24. Results were: normal   Obstetric History OB History  Gravida Para Term Preterm AB Living  0 0 0 0 0 0  SAB IAB Ectopic Multiple Live Births  0 0 0 0 0       07/02/2024    3:26 PM 07/02/2024    3:11 PM 04/17/2021    3:24 PM  Depression screen PHQ 2/9  Decreased Interest 0 0 0  Down, Depressed, Hopeless 0 0 0  PHQ - 2 Score 0 0 0     The following portions of the patient's history were reviewed and updated as appropriate: allergies, current medications, past family history, past medical history, past social history, past surgical history, and problem list.  Review of Systems  All other systems reviewed and are negative.   Past medical history, past surgical history, family history and social history were all reviewed and documented in the EPIC chart.  Exam:  Vitals:   07/02/24 1507  BP: 128/82  Pulse: (!) 112  SpO2: 98%  Weight: 110 lb (49.9 kg)  Height: 5' 1.81 (1.57 m)   Body mass index is 20.24 kg/m.  Physical Exam Vitals and nursing note reviewed. Exam conducted with a chaperone present.  Constitutional:      Appearance: Normal appearance. She is normal weight.  HENT:     Head: Normocephalic and atraumatic.  Neck:     Thyroid : No thyroid  mass, thyromegaly or thyroid  tenderness.  Cardiovascular:     Rate and Rhythm: Regular rhythm.     Heart sounds: Normal heart sounds.  Pulmonary:     Effort: Pulmonary effort is normal.     Breath sounds: Normal breath sounds.  Chest:  Breasts:    Breasts are symmetrical.     Right: Normal. No inverted nipple, mass, nipple discharge, skin change or tenderness.     Left: Normal. No  inverted nipple, mass, nipple discharge, skin change or tenderness.  Abdominal:     General: Abdomen is flat. Bowel sounds are normal.     Palpations: Abdomen is soft.  Genitourinary:    General: Normal vulva.     Vagina: Normal. No vaginal discharge, bleeding or lesions.     Cervix: Normal. No discharge or lesion.     Uterus: Normal. Not enlarged and not tender.      Adnexa: Right adnexa normal and left adnexa normal.       Right: No mass, tenderness or fullness.         Left: No mass, tenderness or fullness.    Lymphadenopathy:     Upper Body:     Right upper body: No axillary adenopathy.     Left upper body: No axillary adenopathy.  Skin:    General: Skin is warm and dry.  Neurological:     Mental Status: She is alert and oriented to person, place, and time.  Psychiatric:        Mood and Affect: Mood normal.        Thought Content: Thought content normal.        Judgment: Judgment normal.      Heinz Senters, CMA present for exam  Assessment/Plan:   1. Well woman exam (Primary) - Cytology - PAP(  [place]) - Declines STI screening  2. LGSIL on Pap smear of cervix  3. Encounter for surveillance of contraceptive pills - Norethindrone-Ethinyl Estradiol-Fe Biphas (LO LOESTRIN FE ) 1 MG-10 MCG / 10 MCG tablet; Take 1 tablet by mouth daily.  Dispense: 84 tablet; Refill: 4    Return in about 1 year (around 07/02/2025) for Annual.  GINETTE COZIER B WHNP-BC 3:28 PM 07/02/2024

## 2024-07-02 NOTE — Patient Instructions (Signed)

## 2024-07-09 ENCOUNTER — Ambulatory Visit: Payer: Self-pay | Admitting: Radiology

## 2024-07-09 LAB — CYTOLOGY - PAP
Comment: NEGATIVE
High risk HPV: POSITIVE — AB

## 2024-08-16 ENCOUNTER — Ambulatory Visit: Payer: Self-pay

## 2024-08-16 NOTE — Telephone Encounter (Signed)
 Reason for Disposition . Headache is a chronic symptom (recurrent or ongoing AND present > 4 weeks)    Routing information to clinic  Protocols used: [facility]

## 2024-08-16 NOTE — Telephone Encounter (Addendum)
 FYI Only or Action Required?: Action required by provider: pt wants to become a pt of Elveria Quarry - pt stated she has seen her since she was little until she moved away & now pt is back.  Pt stated Elveria.informed she accept her as a pt.  Attempted to call [facility] no answer.   Patient was last seen in primary care on 02/25/2024 by Theotis Haze ORN, NP.  Called Nurse Triage reporting Migraine.  Symptoms began 4 or 5 months.  Interventions attempted: Nothing.  Symptoms are: gradually worsening.  Triage Disposition: See PCP Within 2 Weeks, No Contact Calls  Patient/caregiver understands and will follow disposition?: Yes    Call disconnected during triage. Attempted to call patient back, got busy signal. Patient last seen over 3 years ago (Aug 2022).  Answer Assessment - Initial Assessment Questions Patient reports increased frequency of headaches x 5 months, worse/more frequent the last 3 months. Described as intermittent, but lasts a full day. States does not coincide with menstrual cycles. When they occur, they are located behind her eyes, back of head and like a headband. Quality is described as a pressure. Patient last seen >3 years ago.      Pt called back in to complete triage. 1. LOCATION: Where does it hurt?      behind her eyes, back of head and like a headband Back of eyes and shooting into back of eye and sometimes it is like a band around head.  2. ONSET: When did the headache start? (e.g., minutes, hours, days)      5 months ago, worse the past 3 months  3. PATTERN: Does the pain come and go, or has it been constant since it started?     Intermittent  4. SEVERITY: How bad is the pain? and What does it keep you from doing?  (e.g., Scale 1-10; mild, moderate, or severe) 8 or 9/10  5. RECURRENT SYMPTOM: Have you ever had headaches before? If Yes, ask: When was the last time? and What happened that time?      yes  6. CAUSE: What do you think is  causing the headache? unknown  7. MIGRAINE: Have you been diagnosed with migraine headaches? If Yes, ask: Is this headache similar?      no  8. HEAD INJURY: Has there been any recent injury to your head?    no  9. OTHER SYMPTOMS: Do you have any other symptoms? (e.g., fever, stiff neck, eye pain, sore throat, cold symptoms) Flashing lights   10. PREGNANCY: Is there any chance you are pregnant? When was your last menstrual period?   no  Protocols used: Headache-A-AH C

## 2024-08-16 NOTE — Telephone Encounter (Signed)
 Call disconnected during triage. Attempted to call patient back, got busy signal. Patient last seen over 3 years ago (Aug 2022).  Answer Assessment - Initial Assessment Questions Patient reports increased frequency of headaches x 5 months, worse/more frequent the last 3 months. Described as intermittent, but lasts a full day. States does not coincide with menstrual cycles. When they occur, they are located behind her eyes, back of head and like a headband. Quality is described as a pressure. Patient last seen >3 years ago.  1. LOCATION: Where does it hurt?      behind her eyes, back of head and like a headband  2. ONSET: When did the headache start? (e.g., minutes, hours, days)      5 months ago, worse the past 3 months  3. PATTERN: Does the pain come and go, or has it been constant since it started?     Intermittent  4. SEVERITY: How bad is the pain? and What does it keep you from doing?  (e.g., Scale 1-10; mild, moderate, or severe)     *No Answer*  5. RECURRENT SYMPTOM: Have you ever had headaches before? If Yes, ask: When was the last time? and What happened that time?      *No Answer*  6. CAUSE: What do you think is causing the headache?     *No Answer*  7. MIGRAINE: Have you been diagnosed with migraine headaches? If Yes, ask: Is this headache similar?      *No Answer*  8. HEAD INJURY: Has there been any recent injury to your head?      *No Answer*  9. OTHER SYMPTOMS: Do you have any other symptoms? (e.g., fever, stiff neck, eye pain, sore throat, cold symptoms)     *No Answer*  10. PREGNANCY: Is there any chance you are pregnant? When was your last menstrual period?       *No Answer*  Protocols used: [facility] Copied from CRM [id number]. Topic: Clinical - Red Word Triage >> Aug 16, 2024 12:50 PM Marissa P wrote: Red Word that prompted transfer to Nurse Triage: Patient has some dizziness and consistent migraines 4/7 days of the week

## 2024-08-16 NOTE — Telephone Encounter (Signed)
3rd attempt, LVM

## 2024-09-19 ENCOUNTER — Telehealth: Payer: Self-pay | Admitting: Pharmacy Technician

## 2024-09-19 ENCOUNTER — Other Ambulatory Visit (HOSPITAL_COMMUNITY): Payer: Self-pay

## 2024-09-19 ENCOUNTER — Encounter: Payer: Self-pay | Admitting: Physician Assistant

## 2024-09-19 ENCOUNTER — Ambulatory Visit (INDEPENDENT_AMBULATORY_CARE_PROVIDER_SITE_OTHER): Admitting: Physician Assistant

## 2024-09-19 VITALS — BP 136/84 | HR 112 | Resp 18 | Ht 61.0 in | Wt 116.0 lb

## 2024-09-19 DIAGNOSIS — G43E09 Chronic migraine with aura, not intractable, without status migrainosus: Secondary | ICD-10-CM | POA: Diagnosis not present

## 2024-09-19 DIAGNOSIS — Z7689 Persons encountering health services in other specified circumstances: Secondary | ICD-10-CM

## 2024-09-19 MED ORDER — NURTEC 75 MG PO TBDP: 75.0000 mg | ORAL_TABLET | ORAL | 0 refills | Status: DC | PRN

## 2024-09-19 MED ORDER — AMITRIPTYLINE HCL 10 MG PO TABS: 10.0000 mg | ORAL_TABLET | Freq: Every day | ORAL | 1 refills | Status: DC

## 2024-09-19 NOTE — Assessment & Plan Note (Signed)
 Chronic migraines with aura, occurring two to three times weekly for three months, with symptoms of nausea, dizziness, visual aura, photophobia, and phonophobia. Severe migraines cause near syncope and significant discomfort. Strong family history of migraines. Over-the-counter treatments have been ineffective. No acute neurologic deficits on exam today. Discussed both acute and preventative treatments would be beneficial due to frequency and severity. - Start amitriptyline at bedtime for daily preventative treatment. - Start Nurtec for acute migraine treatment. Consider Maxalt if not covered by insurance. - Schedule follow-up in six weeks to assess treatment effectiveness and discuss anxiety if needed. - Consider referral to neurology if current treatment plan is ineffective.

## 2024-09-19 NOTE — Telephone Encounter (Signed)
 Pharmacy Patient Advocate Encounter   Received notification from CoverMyMeds that prior authorization for Nurtec 75MG  dispersible tablets is required/requested.   Insurance verification completed.   The patient is insured through [facility].   Per test claim: PA required; PA submitted to above mentioned insurance via Latent Key/confirmation #/EOC [facility] Status is pending

## 2024-09-19 NOTE — Progress Notes (Signed)
 New Patient Office Visit  Subjective    Patient ID: Cynthia Delacruz, female    DOB: 02/26/2001  Age: 23 y.o. MRN: [id number]  CC:  Chief Complaint  Patient presents with   Establish Care    New pt    Migraine    Pt complains of ongoing frequent migraines     HPI Cynthia Delacruz presents to establish care  Discussed the use of AI scribe software for clinical note transcription with the patient, who gave verbal consent to proceed.  History of Present Illness Cynthia Delacruz is a 23 year old female who presents for reestablishment of care and management of migraines.  She experiences migraines two to three times per week, with symptoms including nausea, dizziness, photophobia, and phonophobia. Headaches vary in nature, sometimes presenting as a 'headband' sensation or 'chopping' pain, and occasionally cause visual changes such as peripheral vision loss or visual speckles. She has felt extremely hot and near syncope, with recent migraines.  She has tried Tylenol, Tylenol Sinus and Headache, Excedrin, and Excedrin Migraine without relief. She takes birth control daily, which has stopped her menstrual periods, but she does not associate it with her migraines.  There is a significant family history of migraines, with her mother and grandmother affected. Her mother currently uses injectable treatments. She tracks her symptoms but has not identified a clear pattern.  She has a history of anxiety, which may exacerbate her migraines, especially during stressful situations. She recently moved back from [place] and started a new job, contributing to her anxiety. No other significant past medical history. She is up to date on her flu shot and Pap.    Outpatient Encounter Medications as of 09/19/2024  Medication Sig   amitriptyline (ELAVIL) 10 MG tablet Take 1 tablet (10 mg total) by mouth at bedtime.   Norethindrone-Ethinyl Estradiol-Fe Biphas (LO LOESTRIN FE ) 1 MG-10 MCG / 10 MCG tablet Take  1 tablet by mouth daily.   Rimegepant Sulfate (NURTEC) 75 MG TBDP Take 1 tablet (75 mg total) by mouth as needed (at onset of migraine).   No facility-administered encounter medications on file as of 09/19/2024.    Past Medical History:  Diagnosis Date   Allergic rhinitis    Migraines     Past Surgical History:  Procedure Laterality Date   TYMPANOPLASTY     replacement x 2   TYMPANOSTOMY TUBE PLACEMENT     UPPER GI ENDOSCOPY     for acid reflux   WISDOM TOOTH EXTRACTION      Family History  Problem Relation Age of Onset   Hyperthyroidism Mother    Other Father        substance abuse   Osteoporosis Maternal Grandmother    Hypertension Maternal Grandmother    Diabetes Paternal Grandmother    Pancreatitis Paternal Grandmother     Social History   Socioeconomic History   Marital status: Single    Spouse name: Not on file   Number of children: Not on file   Years of education: Not on file   Highest education level: Not on file  Occupational History   Not on file  Tobacco Use   Smoking status: Never   Smokeless tobacco: Never  Vaping Use   Vaping status: Never Used  Substance and Sexual Activity   Alcohol use: Never   Drug use: Never   Sexual activity: Yes    Birth control/protection: OCP  Other Topics Concern   Not on file  Social History  Narrative   Not on file   Social Drivers of Health   Financial Resource Strain: Low Risk  (09/19/2024)   Overall Financial Resource Strain (CARDIA)    Difficulty of Paying Living Expenses: Not hard at all  Food Insecurity: No Food Insecurity (09/19/2024)   Hunger Vital Sign    Worried About Running Out of Food in the Last Year: Never true    Ran Out of Food in the Last Year: Never true  Transportation Needs: No Transportation Needs (09/19/2024)   PRAPARE - Administrator, Civil Service (Medical): No    Lack of Transportation (Non-Medical): No  Physical Activity: Insufficiently Active (09/19/2024)   Exercise  Vital Sign    Days of Exercise per Week: 2 days    Minutes of Exercise per Session: 60 min  Stress: No Stress Concern Present (09/19/2024)   [employer] of Occupational Health - Occupational Stress Questionnaire    Feeling of Stress: Only a little  Social Connections: Moderately Isolated (09/19/2024)   Social Connection and Isolation Panel    Frequency of Communication with Friends and Family: More than three times a week    Frequency of Social Gatherings with Friends and Family: More than three times a week    Attends Religious Services: 1 to 4 times per year    Active Member of [employer] or Organizations: No    Attends Banker Meetings: Never    Marital Status: Never married  Intimate Partner Violence: Not At Risk (09/19/2024)   Humiliation, Afraid, Rape, and Kick questionnaire    Fear of Current or Ex-Partner: No    Emotionally Abused: No    Physically Abused: No    Sexually Abused: No    Review of Systems  Constitutional:  Negative for chills, fever and malaise/fatigue.  Eyes:  Positive for photophobia. Negative for blurred vision and double vision.  Gastrointestinal:  Positive for nausea. Negative for vomiting.  Neurological:  Positive for dizziness and headaches. Negative for tingling, sensory change, focal weakness and loss of consciousness.      Objective    BP 136/84   Pulse (!) 112   Resp 18   Ht 5' 1 (1.549 m)   Wt 116 lb (52.6 kg)   SpO2 98%   BMI 21.92 kg/m   Physical Exam Constitutional:      General: She is not in acute distress.    Appearance: Normal appearance. She is normal weight. She is not ill-appearing.  HENT:     Head: Normocephalic and atraumatic.     Mouth/Throat:     Mouth: Mucous membranes are moist.     Pharynx: Oropharynx is clear.  Eyes:     Extraocular Movements: Extraocular movements intact.     Conjunctiva/sclera: Conjunctivae normal.  Cardiovascular:     Rate and Rhythm: Normal rate and regular rhythm.     Heart  sounds: Normal heart sounds. No murmur heard. Pulmonary:     Effort: Pulmonary effort is normal.     Breath sounds: Normal breath sounds.  Skin:    General: Skin is warm and dry.  Neurological:     General: No focal deficit present.     Mental Status: She is alert and oriented to person, place, and time.  Psychiatric:        Mood and Affect: Mood normal.        Behavior: Behavior normal.       Assessment & Plan:  Encounter to establish care  Chronic migraine with  aura without status migrainosus, not intractable Assessment & Plan: Chronic migraines with aura, occurring two to three times weekly for three months, with symptoms of nausea, dizziness, visual aura, photophobia, and phonophobia. Severe migraines cause near syncope and significant discomfort. Strong family history of migraines. Over-the-counter treatments have been ineffective. No acute neurologic deficits on exam today. Discussed both acute and preventative treatments would be beneficial due to frequency and severity. - Start amitriptyline at bedtime for daily preventative treatment. - Start Nurtec for acute migraine treatment. Consider Maxalt if not covered by insurance. - Schedule follow-up in six weeks to assess treatment effectiveness and discuss anxiety if needed. - Consider referral to neurology if current treatment plan is ineffective.  Orders: -     Nurtec; Take 1 tablet (75 mg total) by mouth as needed (at onset of migraine).  Dispense: 16 tablet; Refill: 0 -     Amitriptyline HCl; Take 1 tablet (10 mg total) by mouth at bedtime.  Dispense: 30 tablet; Refill: 1   Return in about 6 weeks (around 10/31/2024) for migraines.   Charmaine Lenix Kidd, PA-C

## 2024-09-20 ENCOUNTER — Encounter: Payer: Self-pay | Admitting: Physician Assistant

## 2024-09-20 ENCOUNTER — Other Ambulatory Visit: Payer: Self-pay | Admitting: Physician Assistant

## 2024-09-20 DIAGNOSIS — G43E09 Chronic migraine with aura, not intractable, without status migrainosus: Secondary | ICD-10-CM

## 2024-09-20 MED ORDER — RIZATRIPTAN BENZOATE 10 MG PO TABS: 10.0000 mg | ORAL_TABLET | ORAL | 0 refills | Status: AC | PRN

## 2024-09-20 NOTE — Telephone Encounter (Signed)
 Pharmacy Patient Advocate Encounter  Received notification from [facility] that Prior Authorization for Nurtec 75MG  dispersible tablets has been DENIED.  Full denial letter will be uploaded to the media tab. See denial reason below.   PA #/Case ID/Reference #: [id number]

## 2024-11-02 ENCOUNTER — Encounter: Payer: Self-pay | Admitting: Physician Assistant

## 2024-11-02 ENCOUNTER — Ambulatory Visit (INDEPENDENT_AMBULATORY_CARE_PROVIDER_SITE_OTHER): Admitting: Physician Assistant

## 2024-11-02 VITALS — BP 122/81 | HR 92 | Temp 97.5°F | Ht 61.0 in | Wt 116.8 lb

## 2024-11-02 DIAGNOSIS — Z8349 Family history of other endocrine, nutritional and metabolic diseases: Secondary | ICD-10-CM | POA: Diagnosis not present

## 2024-11-02 DIAGNOSIS — G43E09 Chronic migraine with aura, not intractable, without status migrainosus: Secondary | ICD-10-CM | POA: Diagnosis not present

## 2024-11-02 MED ORDER — AMITRIPTYLINE HCL 10 MG PO TABS: 10.0000 mg | ORAL_TABLET | Freq: Every day | ORAL | 1 refills | Status: AC

## 2024-11-02 MED ORDER — NURTEC 75 MG PO TBDP
1.0000 | ORAL_TABLET | ORAL | 1 refills | Status: AC | PRN
Start: 2024-11-02 — End: ?

## 2024-11-02 NOTE — Progress Notes (Signed)
 Established Patient Office Visit  Subjective   Patient ID: Cynthia Delacruz, female    DOB: 10/23/2001  Age: 23 y.o. MRN: [id number]  Chief Complaint  Patient presents with   Follow-up    Patient is here for a follow up on migraine medication. She mentioned it has been helping She mentioned wanting to get lab work done. To check her thyroid      Discussed the use of AI scribe software for clinical note transcription with the patient, who gave verbal consent to proceed.  History of Present Illness Cynthia Delacruz is a 23 year old female who presents with headaches and a request for thyroid  evaluation.  She experiences headaches managed with amitriptyline  and Maxalt . Amitriptyline  10 mg at bedtime has reduced her headaches to about one per week or every other week. Maxalt  caused adverse effects, including a sensation of spinning, leading to its discontinuation.   She is concerned about her thyroid  health due to a family history of thyroid  issues, with her mother and grandmother having significant thyroid  problems. It has been nearly two years since her last thyroid  blood work, which showed levels that were slightly abnormal. She experiences hair loss but denies diarrhea, constipation, hot or cold intolerances, or tremors.   Review of Systems  Constitutional:  Negative for chills, fever and malaise/fatigue.  Eyes:  Negative for blurred vision and double vision.  Respiratory:  Negative for cough and shortness of breath.   Cardiovascular:  Negative for chest pain and palpitations.  Neurological:  Positive for headaches (improving). Negative for dizziness.      Objective:     BP 122/81 (BP Location: Right Arm, Patient Position: Sitting)   Pulse 92   Temp (!) 97.5 F (36.4 C)   Ht 5' 1 (1.549 m)   Wt 116 lb 12 oz (53 kg)   SpO2 100%   BMI 22.06 kg/m    Physical Exam Constitutional:      General: She is not in acute distress.    Appearance: Normal appearance. She is normal  weight. She is not ill-appearing.  HENT:     Head: Normocephalic and atraumatic.     Mouth/Throat:     Mouth: Mucous membranes are moist.     Pharynx: Oropharynx is clear.  Eyes:     Extraocular Movements: Extraocular movements intact.     Conjunctiva/sclera: Conjunctivae normal.  Cardiovascular:     Rate and Rhythm: Normal rate and regular rhythm.     Heart sounds: Normal heart sounds. No murmur heard. Pulmonary:     Effort: Pulmonary effort is normal.     Breath sounds: Normal breath sounds.  Skin:    General: Skin is warm and dry.  Neurological:     General: No focal deficit present.     Mental Status: She is alert and oriented to person, place, and time.  Psychiatric:        Mood and Affect: Mood normal.        Behavior: Behavior normal.     No results found for any visits on 11/02/24.  The ASCVD Risk score (Arnett DK, et al., 2019) failed to calculate for the following reasons:   The 2019 ASCVD risk score is only valid for ages 73 to 23    Assessment & Plan:   Return in about 6 months (around 05/02/2025) for migraines or sooner as needed.   Chronic migraine with aura without status migrainosus, not intractable Assessment & Plan: Chronic migraines are well-controlled with amitriptyline . Maxalt   was not tolerated due to adverse effects. Nurtec anticipated to be effective. - Continue amitriptyline  10 mg at bedtime. Follow up sooner  if migraine frequency increases. Medication refilled.  - Start Nurtec for abortive therapy.  - Follow up in 6 months.    Orders: -     Nurtec; Take 1 tablet (75 mg total) by mouth as needed (migraine).  Dispense: 30 tablet; Refill: 1 -     Amitriptyline  HCl; Take 1 tablet (10 mg total) by mouth at bedtime.  Dispense: 90 tablet; Refill: 1  Family history of thyroid  disease in mother -     TSH + free T4    Jannis Atkins, PA-C

## 2024-11-02 NOTE — Assessment & Plan Note (Signed)
 Chronic migraines are well-controlled with amitriptyline . Maxalt  was not tolerated due to adverse effects. Nurtec anticipated to be effective. - Continue amitriptyline  10 mg at bedtime. Follow up sooner  if migraine frequency increases. Medication refilled.  - Start Nurtec for abortive therapy.  - Follow up in 6 months.

## 2024-11-03 LAB — TSH+FREE T4
Free T4: 1.22 ng/dL (ref 0.82–1.77)
TSH: 1.35 u[IU]/mL (ref 0.450–4.500)

## 2024-11-05 ENCOUNTER — Ambulatory Visit: Payer: Self-pay | Admitting: Physician Assistant

## 2024-11-22 ENCOUNTER — Encounter: Payer: Self-pay | Admitting: Physician Assistant
